# Patient Record
Sex: Female | Born: 1985 | Race: White | Hispanic: No | Marital: Single | Smoking: Current every day smoker
Health system: Southern US, Community
[De-identification: ages and names within clinical notes are randomized; demographics above are authoritative.]

## PROBLEM LIST (undated history)

## (undated) DIAGNOSIS — O169 Unspecified maternal hypertension, unspecified trimester: Secondary | ICD-10-CM

## (undated) DIAGNOSIS — E119 Type 2 diabetes mellitus without complications: Secondary | ICD-10-CM

## (undated) DIAGNOSIS — D649 Anemia, unspecified: Secondary | ICD-10-CM

## (undated) HISTORY — DX: Type 2 diabetes mellitus without complications: E11.9

## (undated) HISTORY — DX: Unspecified maternal hypertension, unspecified trimester: O16.9

## (undated) HISTORY — DX: Anemia, unspecified: D64.9

---

## 2004-11-02 ENCOUNTER — Emergency Department: Payer: Self-pay | Admitting: Emergency Medicine

## 2005-06-08 ENCOUNTER — Emergency Department: Payer: Self-pay | Admitting: Emergency Medicine

## 2008-07-23 ENCOUNTER — Emergency Department: Payer: Self-pay | Admitting: Emergency Medicine

## 2009-04-15 ENCOUNTER — Emergency Department: Payer: Self-pay | Admitting: Emergency Medicine

## 2009-08-11 ENCOUNTER — Emergency Department: Payer: Self-pay | Admitting: Emergency Medicine

## 2009-09-14 ENCOUNTER — Encounter: Payer: Self-pay | Admitting: Maternal & Fetal Medicine

## 2009-09-20 ENCOUNTER — Emergency Department: Payer: Self-pay | Admitting: Emergency Medicine

## 2009-09-21 ENCOUNTER — Encounter: Payer: Self-pay | Admitting: Obstetrics and Gynecology

## 2009-09-21 ENCOUNTER — Emergency Department: Payer: Self-pay

## 2009-09-24 ENCOUNTER — Emergency Department: Payer: Self-pay | Admitting: Emergency Medicine

## 2010-02-07 ENCOUNTER — Emergency Department: Payer: Self-pay | Admitting: Emergency Medicine

## 2010-02-23 ENCOUNTER — Emergency Department: Payer: Self-pay | Admitting: Emergency Medicine

## 2010-04-12 ENCOUNTER — Encounter: Payer: Self-pay | Admitting: Maternal & Fetal Medicine

## 2010-04-16 ENCOUNTER — Observation Stay: Payer: Self-pay | Admitting: Obstetrics and Gynecology

## 2010-04-26 ENCOUNTER — Encounter: Payer: Self-pay | Admitting: Maternal and Fetal Medicine

## 2010-04-29 ENCOUNTER — Encounter: Payer: Self-pay | Admitting: Maternal & Fetal Medicine

## 2010-05-27 ENCOUNTER — Encounter: Payer: Self-pay | Admitting: Maternal and Fetal Medicine

## 2010-06-07 ENCOUNTER — Encounter: Payer: Self-pay | Admitting: Obstetrics and Gynecology

## 2010-07-07 ENCOUNTER — Encounter: Payer: Self-pay | Admitting: Maternal & Fetal Medicine

## 2010-07-29 ENCOUNTER — Observation Stay: Payer: Self-pay | Admitting: Obstetrics and Gynecology

## 2010-08-12 ENCOUNTER — Inpatient Hospital Stay: Payer: Self-pay | Admitting: Obstetrics and Gynecology

## 2010-10-29 ENCOUNTER — Ambulatory Visit: Payer: Self-pay | Admitting: Internal Medicine

## 2010-10-31 ENCOUNTER — Emergency Department: Payer: Self-pay | Admitting: Emergency Medicine

## 2010-11-14 ENCOUNTER — Inpatient Hospital Stay: Payer: Self-pay | Admitting: Internal Medicine

## 2010-11-22 ENCOUNTER — Other Ambulatory Visit: Payer: Self-pay

## 2010-11-25 ENCOUNTER — Ambulatory Visit: Payer: Self-pay

## 2010-11-28 ENCOUNTER — Ambulatory Visit: Payer: Self-pay | Admitting: Internal Medicine

## 2010-11-29 ENCOUNTER — Other Ambulatory Visit: Payer: Self-pay

## 2010-12-02 ENCOUNTER — Other Ambulatory Visit: Payer: Self-pay

## 2011-04-08 ENCOUNTER — Emergency Department: Payer: Self-pay | Admitting: *Deleted

## 2011-09-18 IMAGING — US US OB FOLLOW-UP - NRPT [facility]
1 series · 14 of 28 positions shown · non-contrast
Comparison: none

[Series 1: us ob follow-up - nrpt [facility] · 14 of 44 slices shown]
[im 2/44]
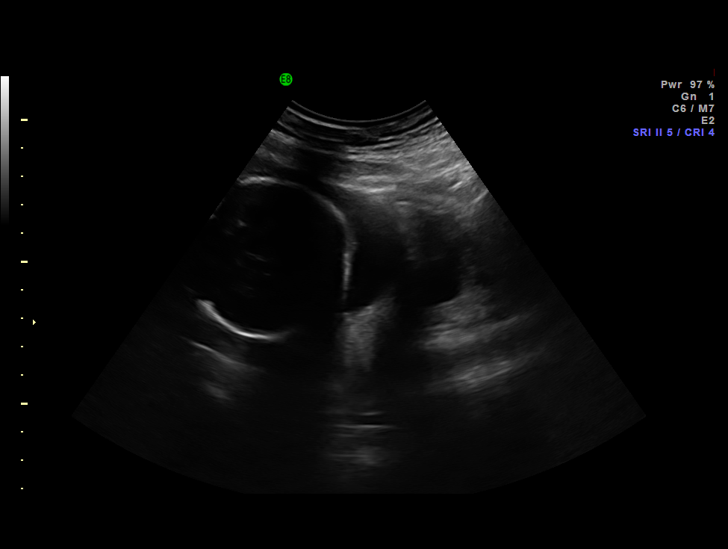
[im 5/44]
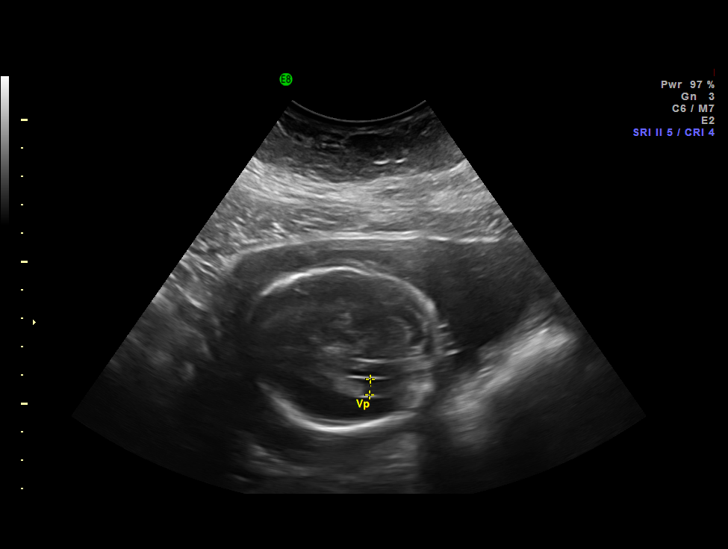
[im 8/44]
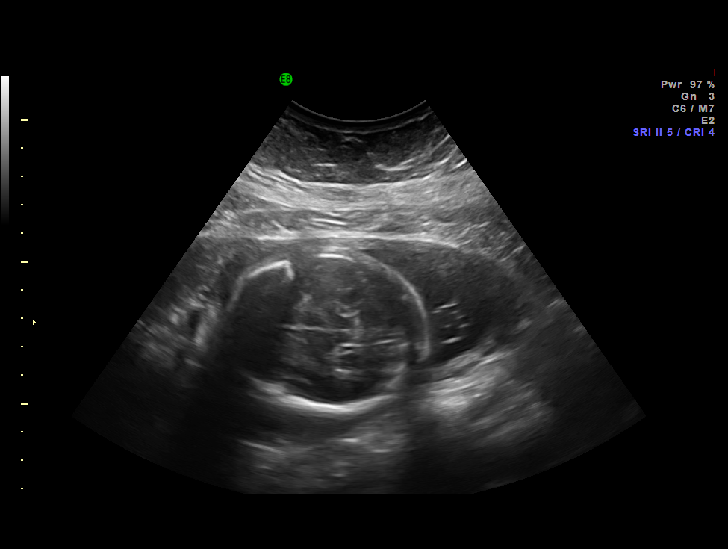
[im 12/44]
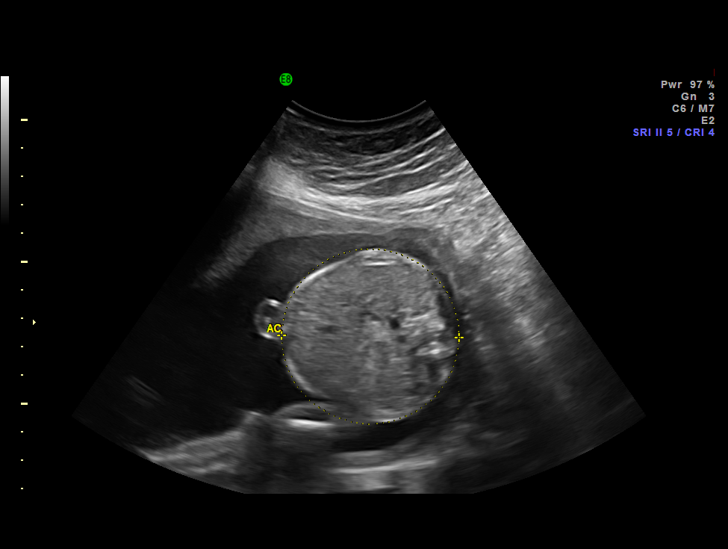
[im 15/44]
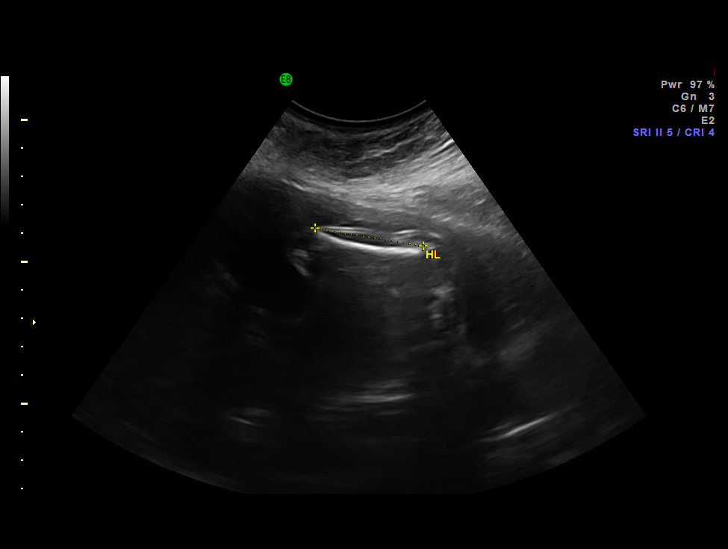
[im 18/44]
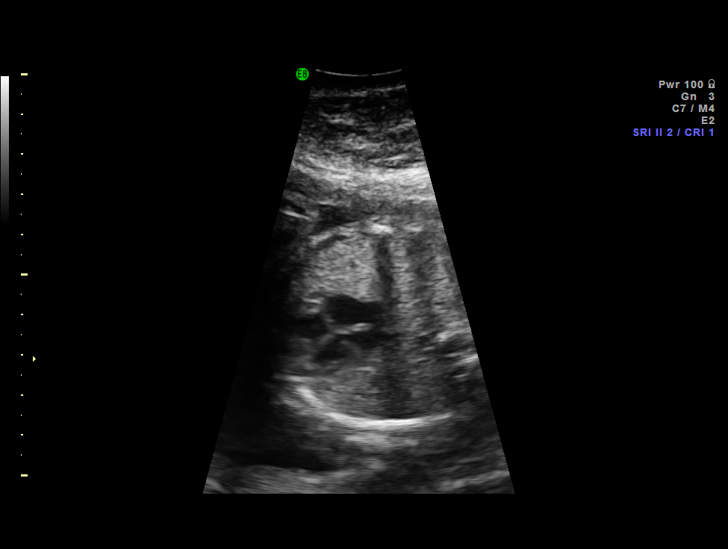
[im 21/44]
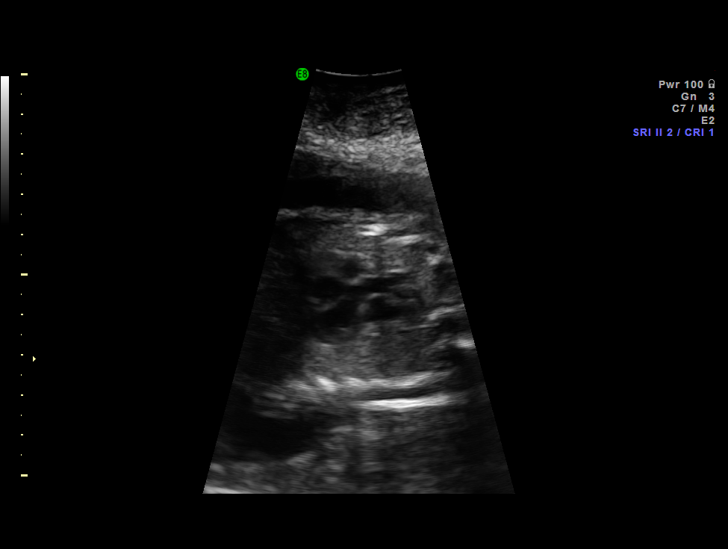
[im 24/44]
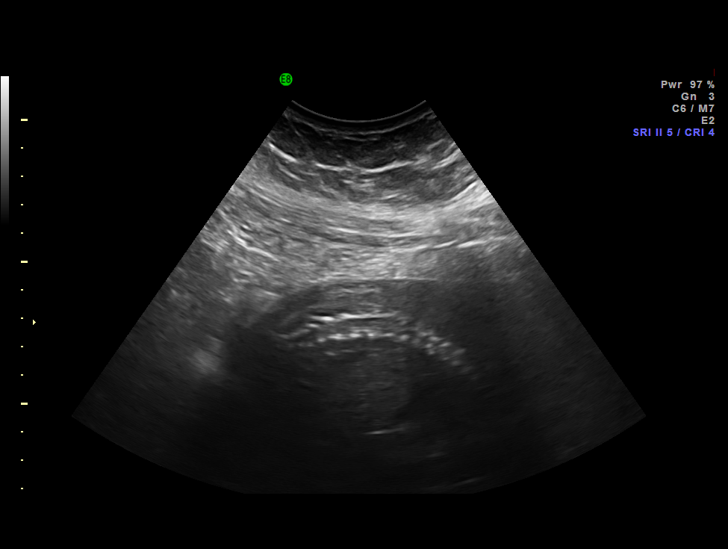
[im 28/44]
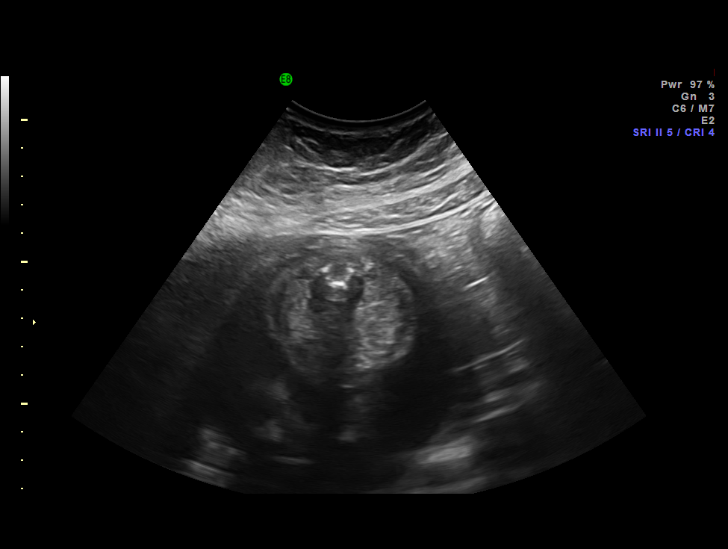
[im 31/44]
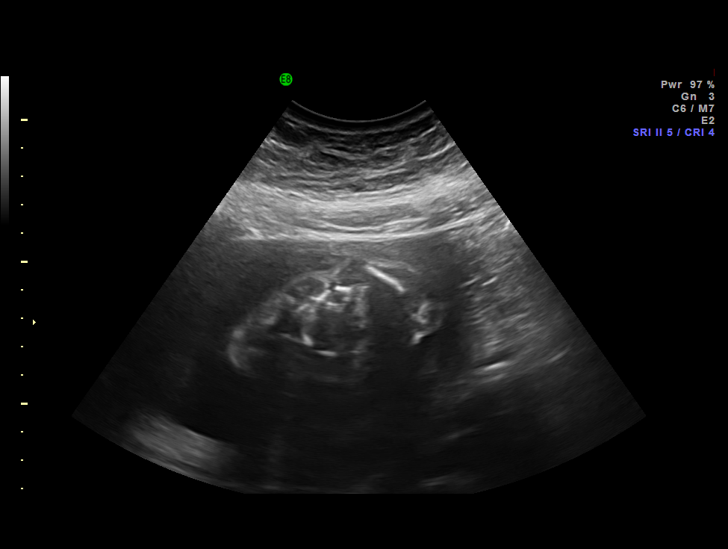
[im 34/44]
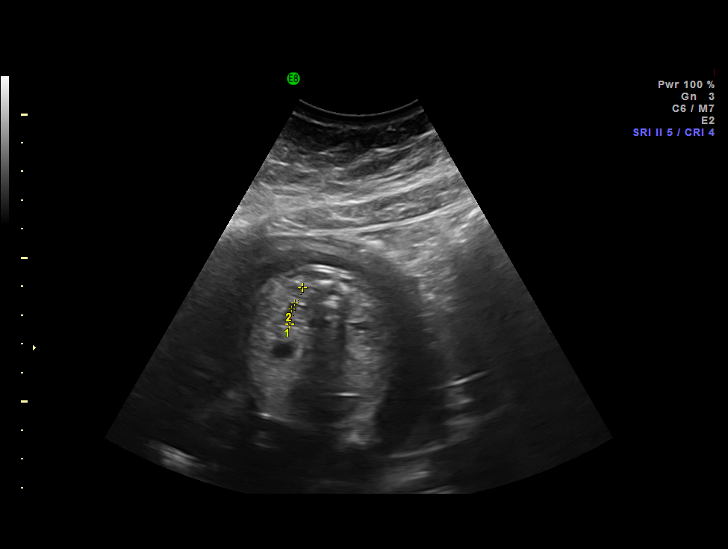
[im 37/44]
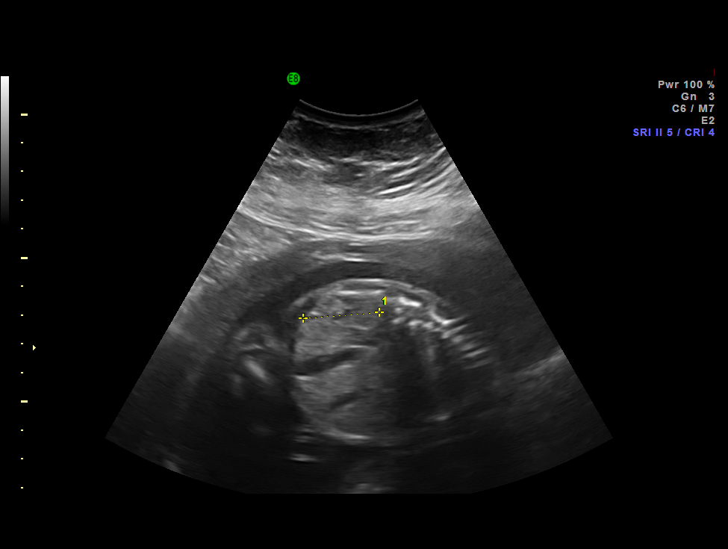
[im 40/44]
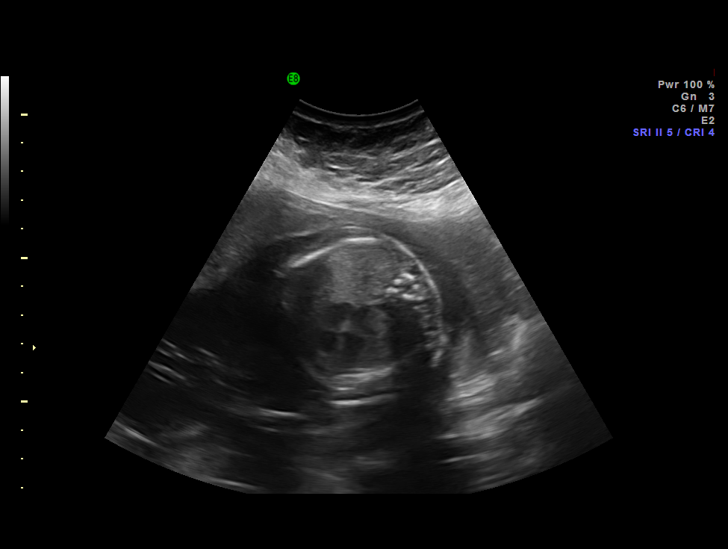
[im 44/44]
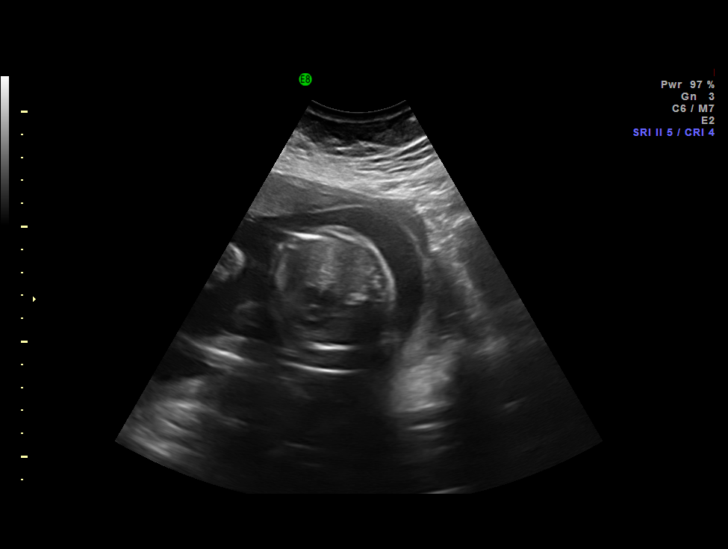

[14 of 28 positions shown; findings below may reference images not displayed]

IMAGES IMPORTED FROM THE [employer] WORKFLOW SYSTEM
NO DICTATION FOR STUDY

## 2012-04-10 IMAGING — CT CT HEAD WITHOUT CONTRAST
2 series · 16 of 30 positions shown, 20 images · non-contrast
Comparison: none

REASON FOR EXAM: headaches and trauma
COMMENTS:

PROCEDURE:     CT  - CT HEAD WITHOUT CONTRAST  - November 17, 2010 [DATE]
RESULT:     Comparison:  None
TECHNIQUE: Multiple axial images from the foramen magnum to the vertex were
obtained without IV contrast.

[Series 2: without · axial · non-contrast · 0.40mm/px · z∈[+484,+604]mm · 13 of 29 slices shown, 17 images]
[im 3/29  brain]
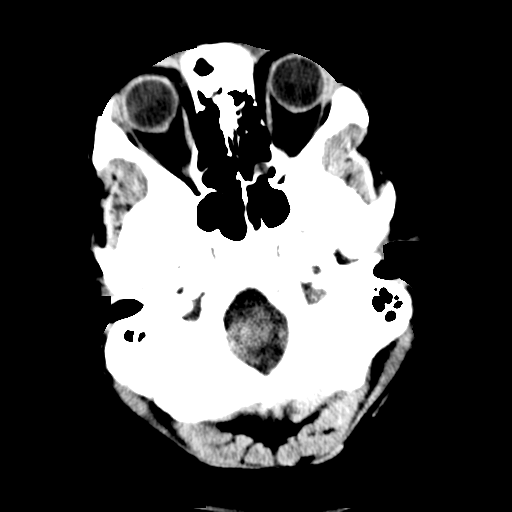
[im 3/29  bone]
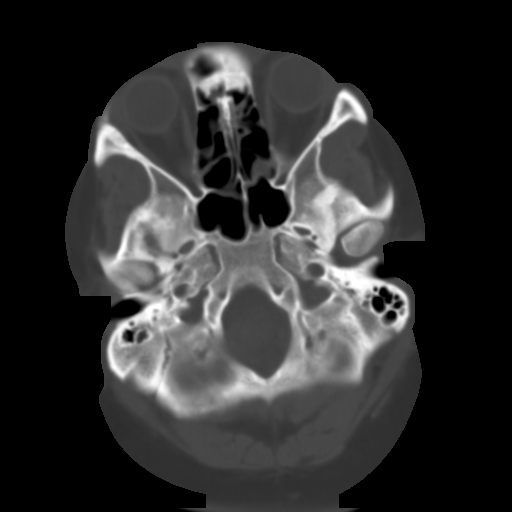
[im 5/29  brain]
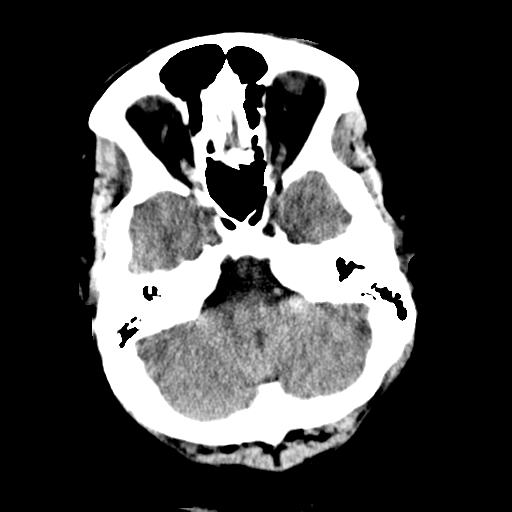
[im 7/29  brain]
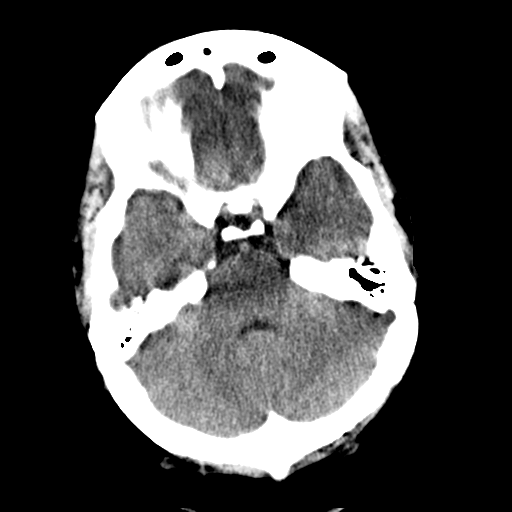
[im 9/29  brain]
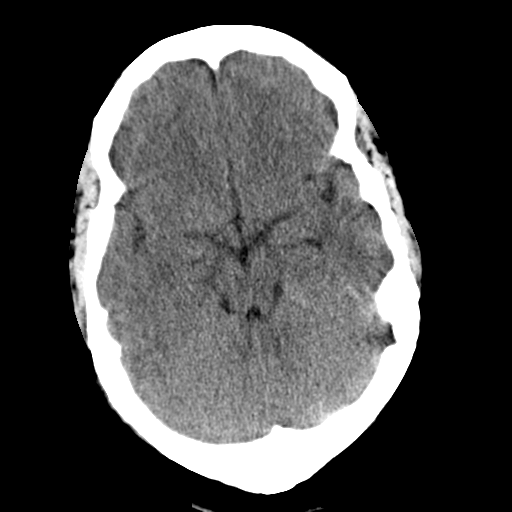
[im 11/29  brain]
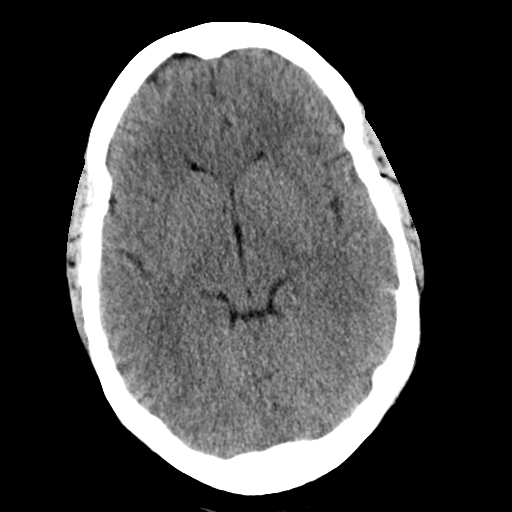
[im 11/29  bone]
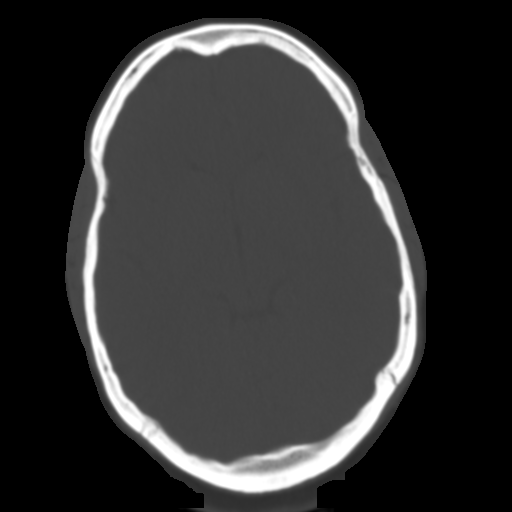
[im 13/29  brain]
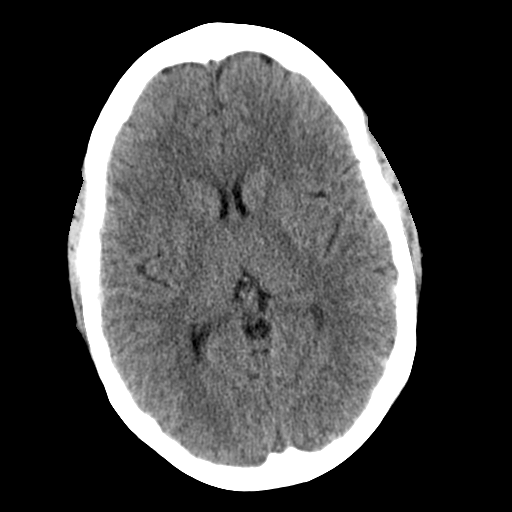
[im 15/29  brain]
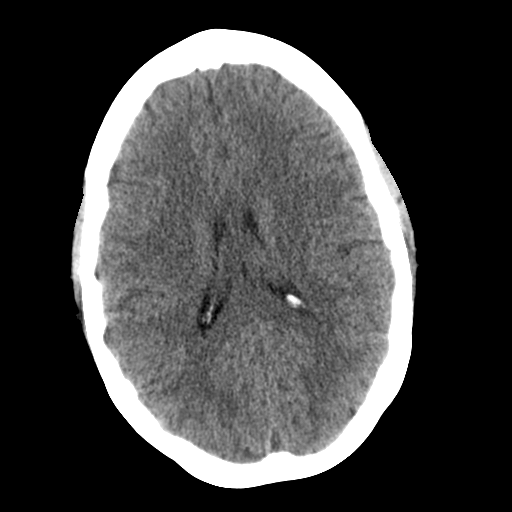
[im 17/29  brain]
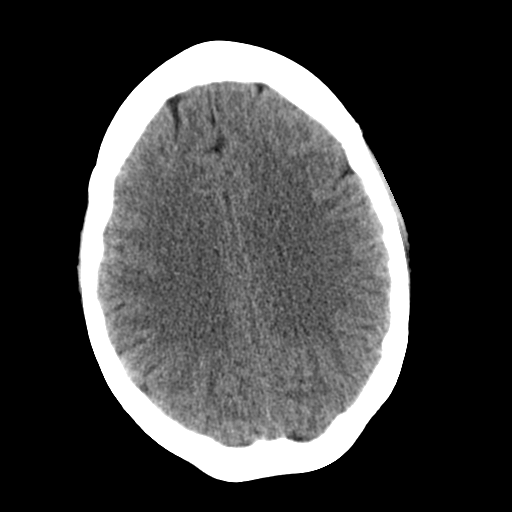
[im 19/29  brain]
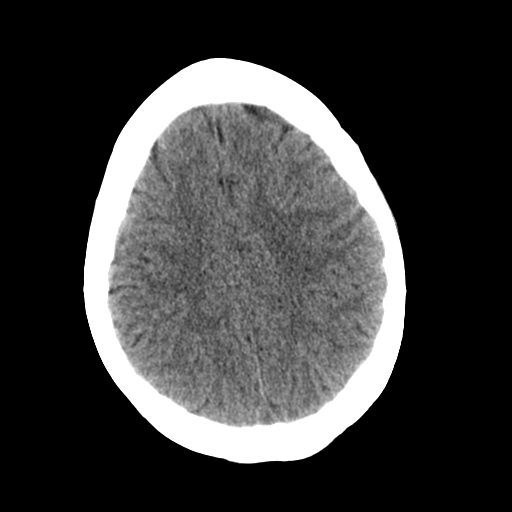
[im 19/29  bone]
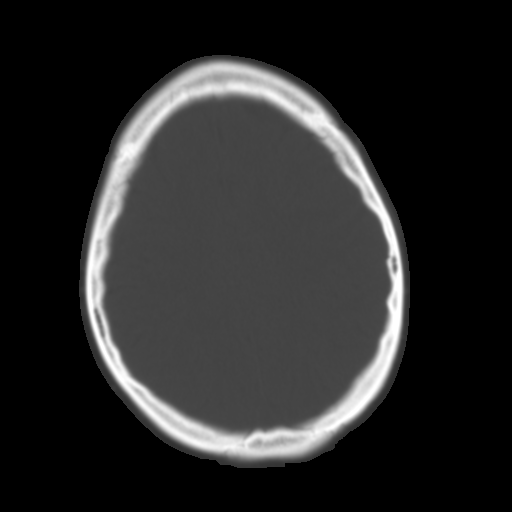
[im 21/29  brain]
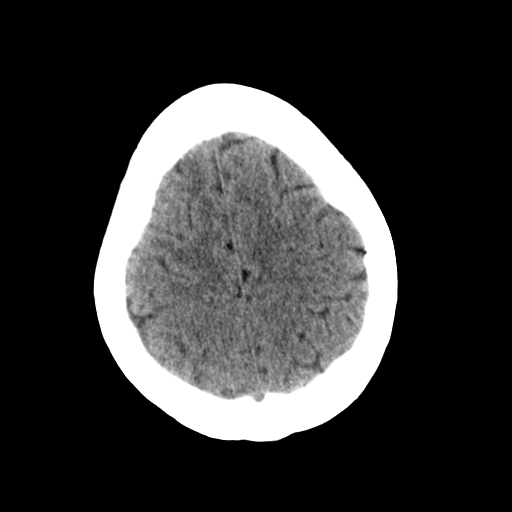
[im 23/29  brain]
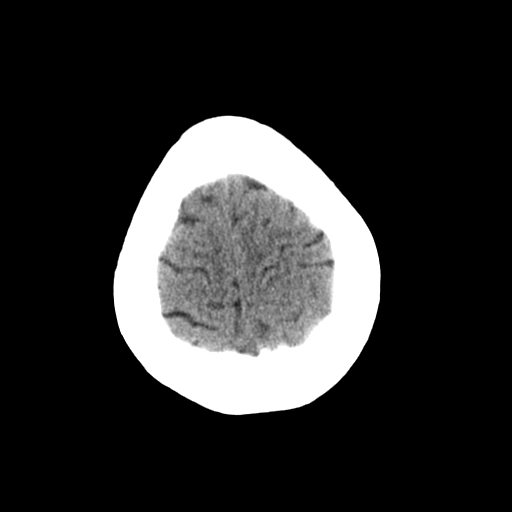
[im 25/29  brain]
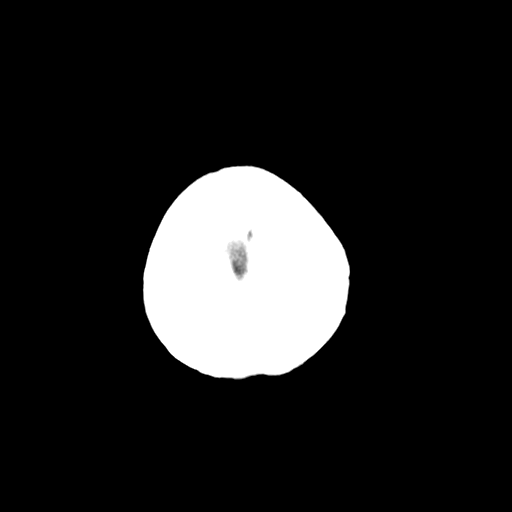
[im 27/29  brain]
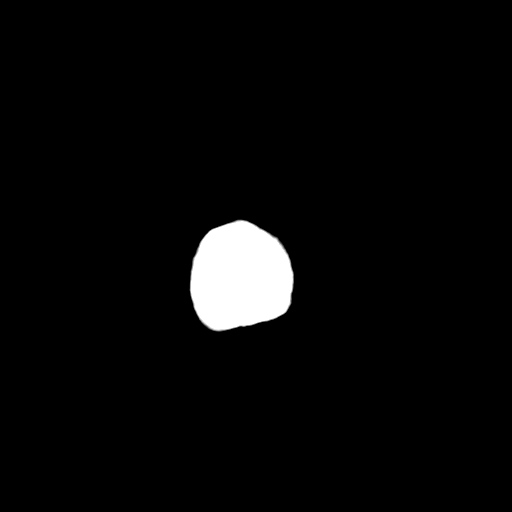
[im 27/29  bone]
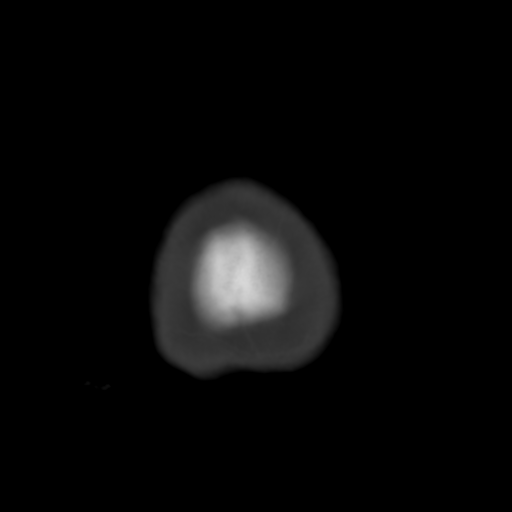

[Series 3: bone · axial · 0.40mm/px · z∈[+484,+524]mm · 3 of 29 slices shown]
[im 3/29  bone]
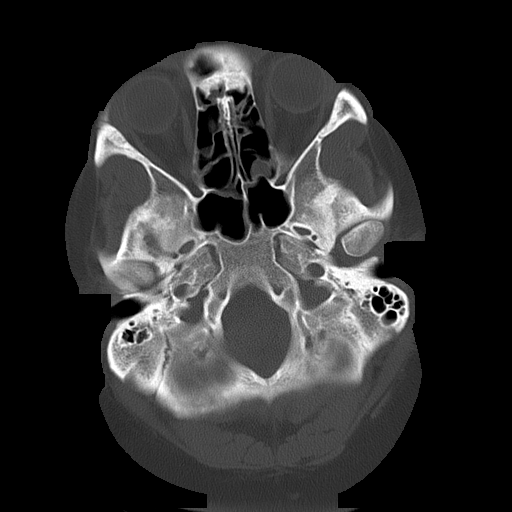
[im 7/29  bone]
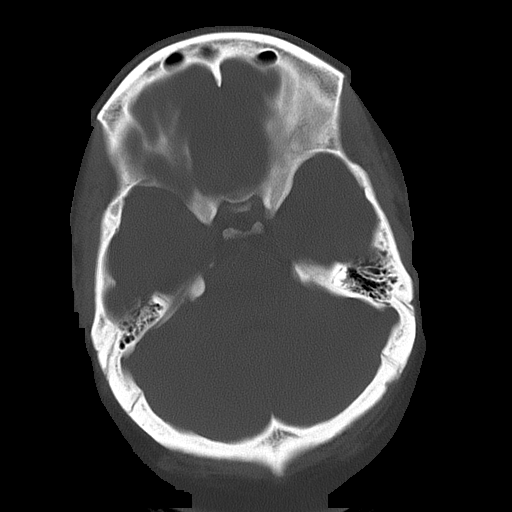
[im 11/29  bone]
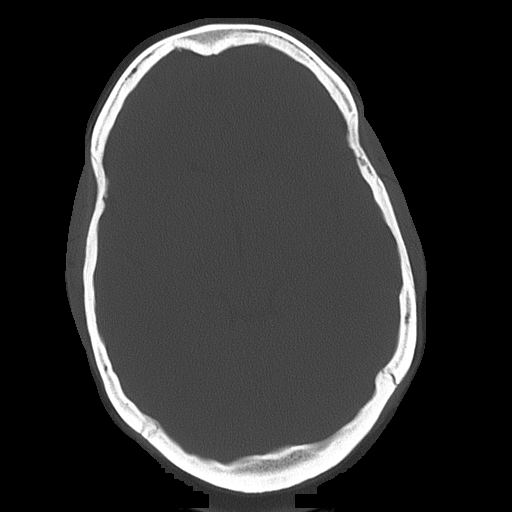

[16 of 30 positions shown; findings below may reference images not displayed]

FINDINGS: There is no evidence for mass effect, midline shift, or extra-axial fluid
collections. There is no evidence for space-occupying lesion, intracranial
hemorrhage, or cortical-based area of infarction.

There is mild mucosal thickening of the ethmoid air cells.

The osseous structures are unremarkable.
IMPRESSION: No acute intracranial process.

## 2018-12-01 ENCOUNTER — Other Ambulatory Visit: Payer: Self-pay

## 2018-12-01 ENCOUNTER — Emergency Department
Admission: EM | Admit: 2018-12-01 | Discharge: 2018-12-02 | Disposition: A | Discharge status: Home / Self Care | Payer: BC Managed Care – PPO | Attending: Emergency Medicine | Admitting: Emergency Medicine

## 2018-12-01 ENCOUNTER — Encounter: Payer: Self-pay | Admitting: Emergency Medicine

## 2018-12-01 DIAGNOSIS — Y906 Blood alcohol level of 120-199 mg/100 ml: Secondary | ICD-10-CM | POA: Diagnosis not present

## 2018-12-01 DIAGNOSIS — F10929 Alcohol use, unspecified with intoxication, unspecified: Secondary | ICD-10-CM | POA: Insufficient documentation

## 2018-12-01 DIAGNOSIS — F17210 Nicotine dependence, cigarettes, uncomplicated: Secondary | ICD-10-CM | POA: Diagnosis not present

## 2018-12-01 DIAGNOSIS — F1510 Other stimulant abuse, uncomplicated: Secondary | ICD-10-CM | POA: Diagnosis not present

## 2018-12-01 LAB — CBC
HCT: 41.7 % (ref 36.0–46.0)
Hemoglobin: 13.8 g/dL (ref 12.0–15.0)
MCH: 27.4 pg (ref 26.0–34.0)
MCHC: 33.1 g/dL (ref 30.0–36.0)
MCV: 82.7 fL (ref 80.0–100.0)
Platelets: 292 10*3/uL (ref 150–400)
RBC: 5.04 MIL/uL (ref 3.87–5.11)
RDW: 14.1 % (ref 11.5–15.5)
WBC: 9.6 10*3/uL (ref 4.0–10.5)
nRBC: 0 % (ref 0.0–0.2)
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
## 2018-12-01 NOTE — ED Notes (Signed)
Pt dressed out in hospital provided clothes. Pt has one pain white tennis shoes, one par rainbow socks, one black t-shirt, one white undershirt, one pair jean shirts, one teal bra, one eyebrow piercing, one gray ring, one phone case without phone, one ID, one phone charger, one pair teal underwear, and ten dollars cash.
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
## 2018-12-02 LAB — COMPREHENSIVE METABOLIC PANEL
ALT: 21 U/L (ref 0–44)
AST: 21 U/L (ref 15–41)
Albumin: 3.9 g/dL (ref 3.5–5.0)
Alkaline Phosphatase: 75 U/L (ref 38–126)
Anion gap: 10 (ref 5–15)
BUN: 8 mg/dL (ref 6–20)
CO2: 25 mmol/L (ref 22–32)
Calcium: 8.2 mg/dL — ABNORMAL LOW (ref 8.9–10.3)
Chloride: 105 mmol/L (ref 98–111)
Creatinine, Ser: 0.67 mg/dL (ref 0.44–1.00)
GFR calc Af Amer: >60 mL/min (ref >60)
GFR calc non Af Amer: >60 mL/min (ref >60)
Glucose, Bld: 135 mg/dL — ABNORMAL HIGH (ref 70–99)
Potassium: 3.4 mmol/L — ABNORMAL LOW (ref 3.5–5.1)
Sodium: 140 mmol/L (ref 135–145)
Total Bilirubin: 0.4 mg/dL (ref 0.3–1.2)
Total Protein: 7.9 g/dL (ref 6.5–8.1)

## 2018-12-02 LAB — URINE DRUG SCREEN, QUALITATIVE (ARMC ONLY)
Amphetamines, Ur Screen: POSITIVE — AB
Barbiturates, Ur Screen: NOT DETECTED
Benzodiazepine, Ur Scrn: NOT DETECTED
Cannabinoid 50 Ng, Ur ~~LOC~~: NOT DETECTED
Cocaine Metabolite,Ur ~~LOC~~: NOT DETECTED
MDMA (Ecstasy)Ur Screen: NOT DETECTED
Methadone Scn, Ur: NOT DETECTED
Opiate, Ur Screen: NOT DETECTED
Phencyclidine (PCP) Ur S: NOT DETECTED
Tricyclic, Ur Screen: NOT DETECTED

## 2018-12-02 LAB — POCT PREGNANCY, URINE: Preg Test, Ur: NEGATIVE

## 2018-12-02 LAB — ETHANOL: Alcohol, Ethyl (B): 145 mg/dL — ABNORMAL HIGH (ref <10)

## 2018-12-02 LAB — TROPONIN I (HIGH SENSITIVITY)
Troponin I (High Sensitivity): 8 ng/L (ref <18)
Troponin I (High Sensitivity): 8 ng/L (ref <18)

## 2018-12-02 LAB — SALICYLATE LEVEL: Salicylate Lvl: <7 mg/dL (ref 2.8–30.0)

## 2018-12-02 LAB — ACETAMINOPHEN LEVEL: Acetaminophen (Tylenol), Serum: <10 ug/mL — ABNORMAL LOW (ref 10–30)

## 2018-12-02 NOTE — ED Notes (Signed)
Tele-psych at bedside.

## 2018-12-02 NOTE — Discharge Instructions (Addendum)

## 2018-12-02 NOTE — ED Notes (Signed)
Patient's father Martie Lee) [phone]
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
## 2018-12-02 NOTE — ED Notes (Signed)
Patient extremely tearful, was sitting up on side of bed wishing to leave. Refuses to wear pulse ox and blood pressure cuff

## 2018-12-02 NOTE — ED Notes (Signed)
Patient transferred to room 24 so she's more visible to staff, as she was getting out of bed and closing the door against advisement
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
## 2018-12-21 ENCOUNTER — Other Ambulatory Visit: Payer: Self-pay

## 2018-12-21 DIAGNOSIS — Z20822 Contact with and (suspected) exposure to covid-19: Secondary | ICD-10-CM

## 2018-12-24 LAB — NOVEL CORONAVIRUS, NAA: SARS-CoV-2, NAA: NOT DETECTED

## 2019-01-01 ENCOUNTER — Ambulatory Visit
Admission: EM | Admit: 2019-01-01 | Discharge: 2019-01-01 | Disposition: A | Discharge status: Home / Self Care | Payer: BC Managed Care – PPO | Attending: Family Medicine | Admitting: Family Medicine

## 2019-01-01 ENCOUNTER — Other Ambulatory Visit: Payer: Self-pay

## 2019-01-01 ENCOUNTER — Encounter: Payer: Self-pay | Admitting: Emergency Medicine

## 2019-01-01 DIAGNOSIS — G44209 Tension-type headache, unspecified, not intractable: Secondary | ICD-10-CM

## 2019-01-01 MED ORDER — BUTALBITAL-APAP-CAFFEINE 50-325-40 MG PO TABS: 1.0000 | ORAL_TABLET | Freq: Four times a day (QID) | ORAL | 0 refills | Status: DC | PRN

## 2019-01-01 MED ORDER — TIZANIDINE HCL 4 MG PO TABS: 4.0000 mg | ORAL_TABLET | Freq: Three times a day (TID) | ORAL | 0 refills | Status: DC | PRN

## 2019-01-01 NOTE — ED Triage Notes (Signed)
Pt c/o headache. Started about 9 days ago. She states that it feels like tension. She has tried ibuprofen, tylenol, changing her sleep habits. Pain is located on top and on the sides of her head.

## 2019-01-01 NOTE — ED Provider Notes (Signed)
[facility]    CSN: [id number] Arrival date & time: 01/01/19  1146  History   Chief Complaint Chief Complaint  Patient presents with  . Headache   HPI  33 year old female presents with headache.  Patient reports a 9-day history of diffuse headache.  Located predominantly in the frontal and occipital regions.  She has tried numerous over-the-counter medications including ibuprofen, Tylenol, Excedrin without resolution.  She reports some nausea.  No vomiting.  No fever.  No reported sick contacts.  Pain is currently 5/10 in severity.  She states that she is under a great deal of stress and thinks that this may be contributing.  No known exacerbating factors.  No other associated symptoms.  PMH, Surgical Hx, Family Hx, Social History reviewed and updated as below.  PMH: Hx of alcohol intoxication; Hx of suicidal ideation  Past Surgical History:  Procedure Laterality Date  . CESAREAN SECTION     OB History   No obstetric history on file.     Home Medications    Prior to Admission medications   Medication Sig Start Date End Date Taking? Authorizing Provider  butalbital-acetaminophen-caffeine (FIORICET) 50-325-40 MG tablet Take 1 tablet by mouth every 6 (six) hours as needed for headache. 01/01/19 01/01/20  Coral Spikes, DO  tiZANidine (ZANAFLEX) 4 MG tablet Take 1 tablet (4 mg total) by mouth every 8 (eight) hours as needed for muscle spasms. 01/01/19   Coral Spikes, DO    Family History Family History  Problem Relation Age of Onset  . Healthy Mother   . Hypertension Father     Social History Social History   Tobacco Use  . Smoking status: Current Every Day Smoker    Packs/day: 1.00    Types: Cigarettes  . Smokeless tobacco: Never Used  Substance Use Topics  . Alcohol use: Yes  . Drug use: Not Currently    Types: Marijuana     Allergies   Patient has no known allergies.   Review of Systems Review of Systems  Gastrointestinal: Positive for nausea.   Neurological: Positive for headaches.   Physical Exam Triage Vital Signs ED Triage Vitals  Enc Vitals Group     BP 01/01/19 1251 (!) 155/89     Pulse Rate 01/01/19 1251 89     Resp 01/01/19 1251 18     Temp 01/01/19 1251 98.4 F (36.9 C)     Temp Source 01/01/19 1251 Oral     SpO2 01/01/19 1251 98 %     Weight 01/01/19 1248 270 lb (122.5 kg)     Height 01/01/19 1248 5\' 1"  (1.549 m)     Head Circumference --      Peak Flow --      Pain Score 01/01/19 1248 5     Pain Loc --      Pain Edu? --      Excl. in [place]? --    No data found.  Updated Vital Signs BP (!) 155/89 (BP Location: Right Arm)   Pulse 89   Temp 98.4 F (36.9 C) (Oral)   Resp 18   Ht 5\' 1"  (1.549 m)   Wt 122.5 kg   LMP 12/01/2018 (Approximate)   SpO2 98%   BMI 51.02 kg/m   Visual Acuity Right Eye Distance:   Left Eye Distance:   Bilateral Distance:    Right Eye Near:   Left Eye Near:    Bilateral Near:     Physical Exam Vitals signs and nursing  note reviewed.  Constitutional:      General: She is not in acute distress.    Appearance: She is well-developed. She is obese.  HENT:     Head: Normocephalic and atraumatic.  Eyes:     General:        Right eye: No discharge.        Left eye: No discharge.     Conjunctiva/sclera: Conjunctivae normal.  Cardiovascular:     Rate and Rhythm: Normal rate and regular rhythm.  Pulmonary:     Effort: Pulmonary effort is normal.     Breath sounds: Normal breath sounds.  Neurological:     General: No focal deficit present.     Mental Status: She is alert and oriented to person, place, and time.  Psychiatric:        Mood and Affect: Mood normal.        Behavior: Behavior normal.    UC Treatments / Results  Labs (all labs ordered are listed, but only abnormal results are displayed) Labs Reviewed - No data to display  EKG   Radiology No results found.  Procedures Procedures (including critical care time)  Medications Ordered in UC Medications  - No data to display  Initial Impression / Assessment and Plan / UC Course  I have reviewed the triage vital signs and the nursing notes.  Pertinent labs & imaging results that were available during my care of the patient were reviewed by me and considered in my medical decision making (see chart for details).    33 year old female presents with tension headache.  Treating with Fioricet and Zanaflex.  Final Clinical Impressions(s) / UC Diagnoses   Final diagnoses:  Tension headache   Discharge Instructions   None    ED Prescriptions    Medication Sig Dispense Auth. Provider   butalbital-acetaminophen-caffeine (FIORICET) 50-325-40 MG tablet Take 1 tablet by mouth every 6 (six) hours as needed for headache. 20 tablet Rania Prothero G, DO   tiZANidine (ZANAFLEX) 4 MG tablet Take 1 tablet (4 mg total) by mouth every 8 (eight) hours as needed for muscle spasms. 30 tablet Tommie Samsook, Patric Buckhalter G, DO     Controlled Substance Prescriptions [place] Controlled Substance Registry consulted? Not Applicable   Tommie SamsCook, Remi Rester G, DO 01/01/19 1404

## 2019-03-26 ENCOUNTER — Other Ambulatory Visit: Payer: Self-pay

## 2019-03-26 ENCOUNTER — Emergency Department
Admission: EM | Admit: 2019-03-26 | Discharge: 2019-03-26 | Disposition: A | Discharge status: Home / Self Care | Payer: Worker's Compensation | Attending: Emergency Medicine | Admitting: Emergency Medicine

## 2019-03-26 ENCOUNTER — Emergency Department: Payer: Worker's Compensation

## 2019-03-26 DIAGNOSIS — X500XXA Overexertion from strenuous movement or load, initial encounter: Secondary | ICD-10-CM | POA: Insufficient documentation

## 2019-03-26 DIAGNOSIS — F17210 Nicotine dependence, cigarettes, uncomplicated: Secondary | ICD-10-CM | POA: Insufficient documentation

## 2019-03-26 DIAGNOSIS — M62838 Other muscle spasm: Secondary | ICD-10-CM

## 2019-03-26 DIAGNOSIS — R079 Chest pain, unspecified: Secondary | ICD-10-CM | POA: Diagnosis present

## 2019-03-26 MED ORDER — MELOXICAM 7.5 MG PO TABS: 7.5000 mg | ORAL_TABLET | Freq: Every day | ORAL | 0 refills | Status: DC

## 2019-03-26 MED ORDER — CYCLOBENZAPRINE HCL 5 MG PO TABS: ORAL_TABLET | ORAL | 0 refills | Status: DC

## 2019-03-26 MED ORDER — KETOROLAC TROMETHAMINE 30 MG/ML IJ SOLN
30.0000 mg | Freq: Once | INTRAMUSCULAR | Status: AC
  Administered 2019-03-26: 12:00:00 30 mg via INTRAMUSCULAR
  Filled 2019-03-26: qty 1

## 2019-03-26 MED ORDER — ORPHENADRINE CITRATE 30 MG/ML IJ SOLN
60.0000 mg | Freq: Two times a day (BID) | INTRAMUSCULAR | Status: DC
  Administered 2019-03-26: 12:00:00 60 mg via INTRAMUSCULAR
  Filled 2019-03-26: qty 2

## 2019-03-26 MED ORDER — LIDOCAINE 5 % EX PTCH
1.0000 | MEDICATED_PATCH | CUTANEOUS | Status: DC
  Administered 2019-03-26: 12:00:00 1 via TRANSDERMAL
  Filled 2019-03-26: qty 1

## 2019-03-26 MED ORDER — OXYCODONE-ACETAMINOPHEN 5-325 MG PO TABS
1.0000 | ORAL_TABLET | Freq: Once | ORAL | Status: AC
  Administered 2019-03-26: 1 via ORAL
  Filled 2019-03-26: qty 1

## 2019-03-26 MED ORDER — LIDOCAINE 5 % EX PTCH: 1.0000 | MEDICATED_PATCH | CUTANEOUS | 0 refills | Status: DC

## 2019-03-26 NOTE — ED Triage Notes (Signed)
Pt comes into the ED via EMS from work, states she was lifting a box over her head and felt something pop mid sternum. Pt is in NAD at present. EMS reports 146/80, 85HR, 100% on RA. Pt is in NAD

## 2019-03-26 NOTE — ED Provider Notes (Signed)
[facility] Emergency Department Provider Note  ____________________________________________  Time seen: Approximately 11:14 AM  I have reviewed the triage vital signs and the nursing notes.   HISTORY  Chief Complaint Chest Injury    HPI Olivia Alexander is a 33 y.o. female that presents to the emergency department for evaluation of injury to chest today.  Patient was lifting a heavy box at work when she immediately felt a pop to her central chest.  She states that area feels tight like a muscle spasm.  Area is worse with movement and when she touches area.  No previous injury.   History reviewed. No pertinent past medical history.  There are no active problems to display for this patient.   Past Surgical History:  Procedure Laterality Date  . CESAREAN SECTION      Prior to Admission medications   Medication Sig Start Date End Date Taking? Authorizing Provider  FLUoxetine (PROZAC) 20 MG tablet Take 20 mg by mouth daily.   Yes [provider]  butalbital-acetaminophen-caffeine (FIORICET) [phone] MG tablet Take 1 tablet by mouth every 6 (six) hours as needed for headache. 01/01/19 01/01/20  Coral Spikes, DO  cyclobenzaprine (FLEXERIL) 5 MG tablet Take 1-2 tablets 3 times daily as needed 03/26/19   Laban Emperor, PA-C  lidocaine (LIDODERM) 5 % Place 1 patch onto the skin daily. Remove & Discard patch within 12 hours or as directed by MD 03/26/19   Laban Emperor, PA-C  meloxicam (MOBIC) 7.5 MG tablet Take 1 tablet (7.5 mg total) by mouth daily. 03/26/19 03/25/20  Laban Emperor, PA-C    Allergies Patient has no known allergies.  Family History  Problem Relation Age of Onset  . Healthy Mother   . Hypertension Father     Social History Social History   Tobacco Use  . Smoking status: Current Every Day Smoker    Packs/day: 1.00    Types: Cigarettes  . Smokeless tobacco: Never Used  Substance Use Topics  . Alcohol use: Yes  . Drug use:  Not Currently    Types: Marijuana     Review of Systems  Cardiovascular: Chest wall pain. Respiratory: No SOB. Gastrointestinal: No abdominal pain.  No nausea, no vomiting.  Musculoskeletal: Negative for musculoskeletal pain. Skin: Negative for rash, abrasions, lacerations, ecchymosis. Neurological: Negative for headaches   ____________________________________________   PHYSICAL EXAM:  VITAL SIGNS: ED Triage Vitals  Enc Vitals Group     BP 03/26/19 1012 (!) 148/93     Pulse Rate 03/26/19 1012 80     Resp 03/26/19 1012 18     Temp 03/26/19 1012 98.9 F (37.2 C)     Temp Source 03/26/19 1012 Oral     SpO2 03/26/19 1012 96 %     Weight 03/26/19 1011 270 lb (122.5 kg)     Height 03/26/19 1011 5\' 1"  (1.549 m)     Head Circumference --      Peak Flow --      Pain Score 03/26/19 1011 6     Pain Loc --      Pain Edu? --      Excl. in [place]? --      Constitutional: Alert and oriented. Well appearing and in no acute distress. Eyes: Conjunctivae are normal. PERRL. EOMI. Head: Atraumatic. ENT:      Ears:      Nose: No congestion/rhinnorhea.      Mouth/Throat: Mucous membranes are moist.  Neck: No stridor.  Cardiovascular: Normal rate, regular rhythm.  Good peripheral circulation. Respiratory: Normal respiratory effort without tachypnea or retractions. Lungs CTAB. Good air entry to the bases with no decreased or absent breath sounds. Musculoskeletal: Full range of motion to all extremities. No gross deformities appreciated.  Tenderness to palpation to inferior sternum.  Pain elicited with range of motion. Neurologic:  Normal speech and language. No gross focal neurologic deficits are appreciated.  Skin:  Skin is warm, dry and intact. No rash noted. Psychiatric: Mood and affect are normal. Speech and behavior are normal. Patient exhibits appropriate insight and judgement.   ____________________________________________   LABS (all labs ordered are listed, but only abnormal  results are displayed)  Labs Reviewed - No data to display ____________________________________________  EKG   ____________________________________________  RADIOLOGY Lexine Baton, personally viewed and evaluated these images (plain radiographs) as part of my medical decision making, as well as reviewing the written report by the radiologist.  Dg Chest 2 View  Result Date: 03/26/2019 CLINICAL DATA:  Sternal injury at work. EXAM: CHEST - 2 VIEW COMPARISON:  None. FINDINGS: The cardiac silhouette, mediastinal and hilar contours are normal. The lungs are clear. No pneumothorax or pleural effusion. The bony thorax is intact. The thoracic vertebral bodies are normally aligned. The sternum is intact. The sternoclavicular and AC joints appear normal. IMPRESSION: No acute cardiopulmonary findings. Intact bony thorax. Electronically Signed   By: Rudie Meyer M.D.   On: 03/26/2019 11:17    ____________________________________________    PROCEDURES  Procedure(s) performed:    Procedures    Medications  ketorolac (TORADOL) 30 MG/ML injection 30 mg (30 mg Intramuscular Given 03/26/19 1133)  oxyCODONE-acetaminophen (PERCOCET/ROXICET) 5-325 MG per tablet 1 tablet (1 tablet Oral Given 03/26/19 1132)     ____________________________________________   INITIAL IMPRESSION / ASSESSMENT AND PLAN / ED COURSE  Pertinent labs & imaging results that were available during my care of the patient were reviewed by me and considered in my medical decision making (see chart for details).  Review of the [place] CSRS was performed in accordance of the [facility] prior to dispensing any controlled drugs.   Symptoms are consistent with muscle spasm.  Vital signs and exam are reassuring.  Chest x-ray negative for acute cardiopulmonary processes.  Pain is elicited with range of motion and with palpation.  Symptoms significantly improved with Toradol, Norflex, Percocet.  Patient will be discharged home with  prescriptions for Flexeril, Mobic, Lidoderm. Patient is to follow up with primary care as directed. Patient is given ED precautions to return to the ED for any worsening or new symptoms.  Olivia Alexander was evaluated in Emergency Department on 03/26/2019 for the symptoms described in the history of present illness. She was evaluated in the context of the global COVID-19 pandemic, which necessitated consideration that the patient might be at risk for infection with the SARS-CoV-2 virus that causes COVID-19. Institutional protocols and algorithms that pertain to the evaluation of patients at risk for COVID-19 are in a state of rapid change based on information released by regulatory bodies including the CDC and federal and state organizations. These policies and algorithms were followed during the patient's care in the ED.   ____________________________________________  FINAL CLINICAL IMPRESSION(S) / ED DIAGNOSES  Final diagnoses:  Muscle spasm      NEW MEDICATIONS STARTED DURING THIS VISIT:  ED Discharge Orders         Ordered    cyclobenzaprine (FLEXERIL) 5 MG tablet     03/26/19 1258    lidocaine (LIDODERM) 5 %  Every 24 hours     03/26/19 1258    meloxicam (MOBIC) 7.5 MG tablet  Daily     03/26/19 1258              This chart was dictated using voice recognition software/Dragon. Despite best efforts to proofread, errors can occur which can change the meaning. Any change was purely unintentional.    Enid DerryWagner, Zoiey Christy, PA-C 03/26/19 1706    Dionne BucySiadecki, Sebastian, MD 03/27/19 [phone]

## 2019-03-26 NOTE — ED Notes (Signed)
See triage note  Presents via EMS   States she lifted a box over head  Felt a "pop" in mid chest  Now having some tightness in chest  Denies any fall

## 2019-06-28 ENCOUNTER — Ambulatory Visit
Admission: EM | Admit: 2019-06-28 | Discharge: 2019-06-28 | Disposition: A | Discharge status: Home / Self Care | Payer: BC Managed Care – PPO | Attending: Family Medicine | Admitting: Family Medicine

## 2019-06-28 ENCOUNTER — Other Ambulatory Visit: Payer: Self-pay

## 2019-06-28 ENCOUNTER — Encounter: Payer: Self-pay | Admitting: Emergency Medicine

## 2019-06-28 DIAGNOSIS — A5901 Trichomonal vulvovaginitis: Secondary | ICD-10-CM | POA: Insufficient documentation

## 2019-06-28 DIAGNOSIS — B9689 Other specified bacterial agents as the cause of diseases classified elsewhere: Secondary | ICD-10-CM | POA: Insufficient documentation

## 2019-06-28 DIAGNOSIS — N390 Urinary tract infection, site not specified: Secondary | ICD-10-CM

## 2019-06-28 DIAGNOSIS — N760 Acute vaginitis: Secondary | ICD-10-CM | POA: Diagnosis present

## 2019-06-28 DIAGNOSIS — R319 Hematuria, unspecified: Secondary | ICD-10-CM | POA: Insufficient documentation

## 2019-06-28 DIAGNOSIS — Z113 Encounter for screening for infections with a predominantly sexual mode of transmission: Secondary | ICD-10-CM | POA: Insufficient documentation

## 2019-06-28 LAB — URINALYSIS, COMPLETE (UACMP) WITH MICROSCOPIC
Bilirubin Urine: NEGATIVE
Glucose, UA: NEGATIVE mg/dL
Ketones, ur: NEGATIVE mg/dL
Nitrite: POSITIVE — AB
Protein, ur: NEGATIVE mg/dL
Specific Gravity, Urine: 1.025 (ref 1.005–1.030)
pH: 6 (ref 5.0–8.0)

## 2019-06-28 LAB — WET PREP, GENITAL
Sperm: NONE SEEN
Yeast Wet Prep HPF POC: NONE SEEN

## 2019-06-28 MED ORDER — CEPHALEXIN 500 MG PO CAPS: 500.0000 mg | ORAL_CAPSULE | Freq: Two times a day (BID) | ORAL | 0 refills | Status: AC

## 2019-06-28 MED ORDER — METRONIDAZOLE 500 MG PO TABS: 500.0000 mg | ORAL_TABLET | Freq: Two times a day (BID) | ORAL | 0 refills | Status: DC

## 2019-06-28 NOTE — ED Provider Notes (Signed)
[facility] ____________________________________________  Time seen: Approximately 2:49 PM  I have reviewed the triage vital signs and the nursing notes.   HISTORY  Chief Complaint Dysuria and Vaginal Discharge   HPI Olivia Alexander is a 34 y.o. female presented for evaluation of 2 weeks of urinary frequency, urinary urgency and occasional burning with urination.  Also reports intermittent vaginal irritation and discharge.  States vaginal discharge has been whitish in color.  Sexually active with recent partner change, stating to new partners.  Denies concern of pregnancy.  Does want to have gonorrhea and Chlamydia testing completed.  Denies abdominal pain, back pain, fevers, vomiting, diarrhea or other complaints.  Denies aggravating alleviating factors.  Reports otherwise doing well.  Patient's last menstrual period was 06/18/2019 (approximate).  Denies pregnancy.    History reviewed. No pertinent past medical history.  There are no problems to display for this patient.   Past Surgical History:  Procedure Laterality Date  . CESAREAN SECTION       No current facility-administered medications for this encounter.  Current Outpatient Medications:  .  cephALEXin (KEFLEX) 500 MG capsule, Take 1 capsule (500 mg total) by mouth 2 (two) times daily for 7 days., Disp: 14 capsule, Rfl: 0 .  metroNIDAZOLE (FLAGYL) 500 MG tablet, Take 1 tablet (500 mg total) by mouth 2 (two) times daily., Disp: 14 tablet, Rfl: 0  Allergies Patient has no known allergies.  Family History  Problem Relation Age of Onset  . Healthy Mother   . Hypertension Father     Social History Social History   Tobacco Use  . Smoking status: Current Every Day Smoker    Packs/day: 1.00    Types: Cigarettes  . Smokeless tobacco: Never Used  Substance Use Topics  . Alcohol use: Yes  . Drug use: Not Currently    Types: Marijuana    Review of Systems Constitutional: No fever ENT: No sore  throat. Cardiovascular: Denies chest pain. Respiratory: Denies shortness of breath. Gastrointestinal: No abdominal pain.  No nausea, no vomiting.  No diarrhea.  No constipation. Genitourinary: As above.  Musculoskeletal: Negative for back pain. Skin: Negative for rash.  ____________________________________________   PHYSICAL EXAM:  VITAL SIGNS: ED Triage Vitals  Enc Vitals Group     BP 06/28/19 1405 138/90     Pulse Rate 06/28/19 1405 94     Resp 06/28/19 1405 16     Temp 06/28/19 1405 98.3 F (36.8 C)     Temp Source 06/28/19 1405 Oral     SpO2 06/28/19 1405 96 %     Weight 06/28/19 1402 270 lb (122.5 kg)     Height 06/28/19 1402 5\' 1"  (1.549 m)     Head Circumference --      Peak Flow --      Pain Score 06/28/19 1402 7     Pain Loc --      Pain Edu? --      Excl. in [place]? --     Constitutional: Alert and oriented. Well appearing and in no acute distress. Eyes: Conjunctivae are normal.  ENT      Head: Normocephalic and atraumatic. Cardiovascular: Normal rate, regular rhythm. Grossly normal heart sounds.  Good peripheral circulation. Respiratory: Normal respiratory effort without tachypnea nor retractions. Breath sounds are clear and equal bilaterally. No wheezes, rales, rhonchi. Gastrointestinal: Soft and nontender.No CVA tenderness. Musculoskeletal: Steady gait.  Neurologic:  Normal speech and language.  Speech is normal. No gait instability.  Skin:  Skin is warm,  dry and intact. No rash noted. Psychiatric: Mood and affect are normal. Speech and behavior are normal. Patient exhibits appropriate insight and judgment   ___________________________________________   LABS (all labs ordered are listed, but only abnormal results are displayed)  Labs Reviewed  WET PREP, GENITAL - Abnormal; Notable for the following components:      Result Value   Trich, Wet Prep PRESENT (*)    Clue Cells Wet Prep HPF POC PRESENT (*)    WBC, Wet Prep HPF POC FEW (*)    All other  components within normal limits  URINALYSIS, COMPLETE (UACMP) WITH MICROSCOPIC - Abnormal; Notable for the following components:   APPearance CLOUDY (*)    Hgb urine dipstick SMALL (*)    Nitrite POSITIVE (*)    Leukocytes,Ua SMALL (*)    Bacteria, UA MANY (*)    Trichomonas, UA PRESENT (*)    All other components within normal limits  GC/CHLAMYDIA PROBE AMP  URINE CULTURE  RPR  HIV ANTIBODY (ROUTINE TESTING W REFLEX)   PROCEDURES Procedures    INITIAL IMPRESSION / ASSESSMENT AND PLAN / ED COURSE  Pertinent labs & imaging results that were available during my care of the patient were reviewed by me and considered in my medical decision making (see chart for details).  Well-appearing patient.  No acute distress.  Urinalysis reviewed, UTI, will culture.  Will empirically treat with oral Keflex.  Patient elected for self wet prep and self GC chlamydia, wet prep positive for clue cells and trichomonas.  Will treat with Flagyl twice daily x7 days.  Patient also elected for RPR and HIV testing.  Encouraged no sexual activity for at least 1 week, partners need to be treated and encourage follow-up with primary care health department.Discussed indication, risks and benefits of medications with patient.   Discussed follow up with Primary care physician or health department this week. Discussed follow up and return parameters including no resolution or any worsening concerns. Patient verbalized understanding and agreed to plan.   ____________________________________________   FINAL CLINICAL IMPRESSION(S) / ED DIAGNOSES  Final diagnoses:  Urinary tract infection with hematuria, site unspecified  BV (bacterial vaginosis)  Trichomonas vaginitis  Screen for STD (sexually transmitted disease)     ED Discharge Orders         Ordered    metroNIDAZOLE (FLAGYL) 500 MG tablet  2 times daily     06/28/19 1501    cephALEXin (KEFLEX) 500 MG capsule  2 times daily     06/28/19 1501            Note: This dictation was prepared with Dragon dictation along with smaller phrase technology. Any transcriptional errors that result from this process are unintentional.         Renford Dills, NP 06/28/19 [phone]

## 2019-06-28 NOTE — Discharge Instructions (Addendum)
Take medication as prescribed. Rest. Drink plenty of fluids. No sexual activity for at least 1 week.  Please inform partners as they need to be treated.  Follow up with your primary care physician this week or health department.  Return to Urgent care for new or worsening concerns.

## 2019-06-28 NOTE — ED Triage Notes (Signed)
Patient c/o burning when urinating and urinary urgency that started about 2 weeks ago.  Patient also reports a vaginal discharge white in color.  Patient reports vaginal itching and tenderness.  Patient denies fevers.

## 2019-06-29 LAB — RPR: RPR Ser Ql: NONREACTIVE

## 2019-06-29 LAB — HIV ANTIBODY (ROUTINE TESTING W REFLEX): HIV Screen 4th Generation wRfx: REACTIVE — AB

## 2019-07-01 LAB — URINE CULTURE: Culture: >=100000 — AB

## 2019-07-01 LAB — GC/CHLAMYDIA PROBE AMP
Chlamydia trachomatis, NAA: NEGATIVE
Neisseria Gonorrhoeae by PCR: NEGATIVE

## 2019-07-03 LAB — HIV-1/2 AB - DIFFERENTIATION
HIV 1 Ab: NEGATIVE
HIV 2 Ab: NEGATIVE
Note: NEGATIVE

## 2019-07-03 LAB — RNA QUALITATIVE: HIV 1 RNA Qualitative: 1

## 2019-07-04 ENCOUNTER — Telehealth (HOSPITAL_COMMUNITY): Payer: Self-pay | Admitting: Emergency Medicine

## 2019-07-04 NOTE — Telephone Encounter (Signed)
HIV Negative, false positive on screening. If indicated, pt return in 3 months for repeat testing. Patient contacted by phone and made aware of    results. Pt verbalized understanding and had all questions answered.

## 2021-03-20 ENCOUNTER — Other Ambulatory Visit: Payer: Self-pay

## 2021-03-20 ENCOUNTER — Emergency Department
Admission: EM | Admit: 2021-03-20 | Discharge: 2021-03-20 | Disposition: A | Discharge status: Home / Self Care | Payer: BC Managed Care – PPO | Attending: Emergency Medicine | Admitting: Emergency Medicine

## 2021-03-20 ENCOUNTER — Encounter: Payer: Self-pay | Admitting: Emergency Medicine

## 2021-03-20 DIAGNOSIS — I889 Nonspecific lymphadenitis, unspecified: Secondary | ICD-10-CM

## 2021-03-20 DIAGNOSIS — L732 Hidradenitis suppurativa: Secondary | ICD-10-CM

## 2021-03-20 DIAGNOSIS — F17210 Nicotine dependence, cigarettes, uncomplicated: Secondary | ICD-10-CM | POA: Insufficient documentation

## 2021-03-20 DIAGNOSIS — L040 Acute lymphadenitis of face, head and neck: Secondary | ICD-10-CM | POA: Insufficient documentation

## 2021-03-20 LAB — PREGNANCY, URINE: Preg Test, Ur: NEGATIVE

## 2021-03-20 MED ORDER — DOXYCYCLINE MONOHYDRATE 100 MG PO TABS: 100.0000 mg | ORAL_TABLET | Freq: Two times a day (BID) | ORAL | 0 refills | Status: DC

## 2021-03-20 NOTE — ED Notes (Signed)
Lab called to add on urine preg  

## 2021-03-20 NOTE — ED Triage Notes (Signed)
Pt to ED from home c/o knots to her scalp for a couple weeks gradually getting worse and pain radiating into headache and neck.  Pt denies fevers, states some pain with turning head.  Pt also states has rash and bumps to left armpit and breast and waistline.

## 2021-03-20 NOTE — Discharge Instructions (Signed)
Follow-up with ear nose and throat if the swelling in your neck does not get better with antibiotics Return emergency department worsening Take the antibiotics as prescribed.  Be careful in the sun as this can make you burn if you are in direct sunlight

## 2021-03-20 NOTE — ED Provider Notes (Signed)
[facility] Emergency Department Provider Note  ____________________________________________   Event Date/Time   First MD Initiated Contact with Patient 03/20/21 [phone]     (approximate)  I have reviewed the triage vital signs and the nursing notes.   HISTORY  Chief Complaint Headache    HPI Olivia Alexander is a 35 y.o. female presents emergency department complaining of bumps all over her head, underneath her arms and underneath her abdomen.  States these have happened in the past.  Has swollen area on side of her neck's been there for at least 2 years.  No fever or chills.  No chest pain or shortness of breath.  Would also like to know if she is pregnant  History reviewed. No pertinent past medical history.  There are no problems to display for this patient.   Past Surgical History:  Procedure Laterality Date   CESAREAN SECTION      Prior to Admission medications   Medication Sig Start Date End Date Taking? Authorizing Provider  doxycycline (ADOXA) 100 MG tablet Take 1 tablet (100 mg total) by mouth 2 (two) times daily. 03/20/21  Yes Dyron Kawano, Roselyn Bering, PA-C  metroNIDAZOLE (FLAGYL) 500 MG tablet Take 1 tablet (500 mg total) by mouth 2 (two) times daily. 06/28/19   Renford Dills, NP  FLUoxetine (PROZAC) 20 MG tablet Take 20 mg by mouth daily.  06/28/19  [provider]    Allergies Patient has no known allergies.  Family History  Problem Relation Age of Onset   Healthy Mother    Hypertension Father     Social History Social History   Tobacco Use   Smoking status: Every Day    Packs/day: 1.00    Types: Cigarettes   Smokeless tobacco: Never  Vaping Use   Vaping Use: Never used  Substance Use Topics   Alcohol use: Yes   Drug use: Not Currently    Types: Marijuana    Comment: crack    Review of Systems  Constitutional: No fever/chills Eyes: No visual changes. ENT: No sore throat. Respiratory: Denies cough Cardiovascular:  Denies chest pain Gastrointestinal: Denies abdominal pain Genitourinary: Negative for dysuria. Musculoskeletal: Negative for back pain. Skin: Positive for rash. Psychiatric: no mood changes,     ____________________________________________   PHYSICAL EXAM:  VITAL SIGNS: ED Triage Vitals  Enc Vitals Group     BP 03/20/21 0345 (!) 175/92     Pulse Rate 03/20/21 0345 89     Resp 03/20/21 0345 16     Temp 03/20/21 0345 98.4 F (36.9 C)     Temp Source 03/20/21 0345 Oral     SpO2 03/20/21 0345 99 %     Weight 03/20/21 0349 238 lb (108 kg)     Height 03/20/21 0349 5\' 1"  (1.549 m)     Head Circumference --      Peak Flow --      Pain Score 03/20/21 0346 6     Pain Loc --      Pain Edu? --      Excl. in [place]? --     Constitutional: Alert and oriented. Well appearing and in no acute distress. Eyes: Conjunctivae are normal.  Head: Atraumatic.  Small bumps like abscesses noted in the scalp Nose: No congestion/rhinnorhea. Mouth/Throat: Mucous membranes are moist.   Neck:  supple large lymph node noted on the left side of cervical chain Cardiovascular: Normal rate, regular rhythm. Heart sounds are normal Respiratory: Normal respiratory effort.  No retractions, lungs  c t a  Abd: soft nontender bs normal all 4 quad, scars from abscesses/hidradenitis noted under the pannus GU: deferred Musculoskeletal: FROM all extremities, warm and well perfused Neurologic:  Normal speech and language.  Skin:  Skin is warm, dry and intact. No rash noted.  Multiple abscesses noted typical of hidradenitis, in the axillas under the pannus Psychiatric: Mood and affect are normal. Speech and behavior are normal.  ____________________________________________   LABS (all labs ordered are listed, but only abnormal results are displayed)  Labs Reviewed  PREGNANCY, URINE    ____________________________________________   ____________________________________________  RADIOLOGY    ____________________________________________   PROCEDURES  Procedure(s) performed: No  Procedures    ____________________________________________   INITIAL IMPRESSION / ASSESSMENT AND PLAN / ED COURSE  Pertinent labs & imaging results that were available during my care of the patient were reviewed by me and considered in my medical decision making (see chart for details).   Patient's 35 year old female presents emergency department with headache and abscesses.  See HPI.  Physical exam shows patient appears stable.  Feel like the headache is coming mostly from the number of abscess that she has along with muscle spasm to the left side neck due to lymphadenopathy.  We will do a trial of antibiotic.  If she is not improving she will need to follow-up with ENT to have the lymph node evaluated.  Dermatology for her skin disorder.  Urine pregnancy ordered  Urine pregnancy is negative  Did explain all the findings to the patient.  She was given a prescription for doxycycline.  She is to follow-up with ENT if the swelling in her neck has not gotten better in 1 to 2 weeks.  Return emergency department worsening.  Follow-up with dermatology.  Discharged in stable condition    Olivia Alexander was evaluated in Emergency Department on 03/20/2021 for the symptoms described in the history of present illness. She was evaluated in the context of the global COVID-19 pandemic, which necessitated consideration that the patient might be at risk for infection with the SARS-CoV-2 virus that causes COVID-19. Institutional protocols and algorithms that pertain to the evaluation of patients at risk for COVID-19 are in a state of rapid change based on information released by regulatory bodies including the CDC and federal and state organizations. These policies and algorithms were followed during  the patient's care in the ED.    As part of my medical decision making, I reviewed the following data within the electronic MEDICAL RECORD NUMBER History obtained from family, Nursing notes reviewed and incorporated, Labs reviewed , Old chart reviewed, Notes from prior ED visits, and [place] Controlled Substance Database  ____________________________________________   FINAL CLINICAL IMPRESSION(S) / ED DIAGNOSES  Final diagnoses:  Hidradenitis  Cervical lymphadenitis      NEW MEDICATIONS STARTED DURING THIS VISIT:  New Prescriptions   DOXYCYCLINE (ADOXA) 100 MG TABLET    Take 1 tablet (100 mg total) by mouth 2 (two) times daily.     Note:  This document was prepared using Dragon voice recognition software and may include unintentional dictation errors.    Faythe Ghee, PA-C 03/20/21 0830    Arnaldo Natal, MD 03/20/21 1537

## 2021-05-20 ENCOUNTER — Emergency Department: Payer: Self-pay

## 2021-05-20 ENCOUNTER — Other Ambulatory Visit: Payer: Self-pay

## 2021-05-20 ENCOUNTER — Emergency Department
Admission: EM | Admit: 2021-05-20 | Discharge: 2021-05-20 | Disposition: A | Discharge status: Home / Self Care | Payer: Self-pay | Attending: Emergency Medicine | Admitting: Emergency Medicine

## 2021-05-20 DIAGNOSIS — N939 Abnormal uterine and vaginal bleeding, unspecified: Secondary | ICD-10-CM | POA: Insufficient documentation

## 2021-05-20 DIAGNOSIS — R1031 Right lower quadrant pain: Secondary | ICD-10-CM

## 2021-05-20 DIAGNOSIS — F17210 Nicotine dependence, cigarettes, uncomplicated: Secondary | ICD-10-CM | POA: Insufficient documentation

## 2021-05-20 DIAGNOSIS — N926 Irregular menstruation, unspecified: Secondary | ICD-10-CM | POA: Insufficient documentation

## 2021-05-20 LAB — COMPREHENSIVE METABOLIC PANEL
ALT: 15 U/L (ref 0–44)
AST: 15 U/L (ref 15–41)
Albumin: 3.4 g/dL — ABNORMAL LOW (ref 3.5–5.0)
Alkaline Phosphatase: 63 U/L (ref 38–126)
Anion gap: 4 — ABNORMAL LOW (ref 5–15)
BUN: 12 mg/dL (ref 6–20)
CO2: 27 mmol/L (ref 22–32)
Calcium: 8.3 mg/dL — ABNORMAL LOW (ref 8.9–10.3)
Chloride: 106 mmol/L (ref 98–111)
Creatinine, Ser: 0.47 mg/dL (ref 0.44–1.00)
GFR, Estimated: >60 mL/min (ref >60)
Glucose, Bld: 124 mg/dL — ABNORMAL HIGH (ref 70–99)
Potassium: 3.6 mmol/L (ref 3.5–5.1)
Sodium: 137 mmol/L (ref 135–145)
Total Bilirubin: 0.4 mg/dL (ref 0.3–1.2)
Total Protein: 7.1 g/dL (ref 6.5–8.1)

## 2021-05-20 LAB — CBC WITH DIFFERENTIAL/PLATELET
Abs Immature Granulocytes: 0.03 10*3/uL (ref 0.00–0.07)
Basophils Absolute: 0.1 10*3/uL (ref 0.0–0.1)
Basophils Relative: 1 %
Eosinophils Absolute: 0.2 10*3/uL (ref 0.0–0.5)
Eosinophils Relative: 2 %
HCT: 41.8 % (ref 36.0–46.0)
Hemoglobin: 13.3 g/dL (ref 12.0–15.0)
Immature Granulocytes: 0 %
Lymphocytes Relative: 27 %
Lymphs Abs: 2.8 10*3/uL (ref 0.7–4.0)
MCH: 26.4 pg (ref 26.0–34.0)
MCHC: 31.8 g/dL (ref 30.0–36.0)
MCV: 82.9 fL (ref 80.0–100.0)
Monocytes Absolute: 0.8 10*3/uL (ref 0.1–1.0)
Monocytes Relative: 8 %
Neutro Abs: 6.4 10*3/uL (ref 1.7–7.7)
Neutrophils Relative %: 62 %
Platelets: 300 10*3/uL (ref 150–400)
RBC: 5.04 MIL/uL (ref 3.87–5.11)
RDW: 14.6 % (ref 11.5–15.5)
WBC: 10.3 10*3/uL (ref 4.0–10.5)
nRBC: 0 % (ref 0.0–0.2)

## 2021-05-20 LAB — HCG, QUANTITATIVE, PREGNANCY: hCG, Beta Chain, Quant, S: <1 m[IU]/mL (ref <5)

## 2021-05-20 LAB — POC URINE PREG, ED: Preg Test, Ur: NEGATIVE

## 2021-05-20 NOTE — Discharge Instructions (Signed)
Your work-up is reassuring.  Your ultrasound was negative and there was no evidence of pregnancy.  Return to the ER if you develop worsening pain, fevers, bleeding more than a pad an hour or any other concerns

## 2021-05-20 NOTE — ED Provider Notes (Signed)
[facility] Emergency Department Provider Note  ____________________________________________   Event Date/Time   First MD Initiated Contact with Patient 05/20/21 0813     (approximate)  I have reviewed the triage vital signs and the nursing notes.   HISTORY  Chief Complaint Vaginal Bleeding    HPI Olivia Alexander is a 35 y.o. female G2, P1 who comes in with concerns for vaginal bleeding.  Patient reports that she missed her period last month.  She is worried that she could be pregnant.  She states that she had sex 2 days ago and had a little bit of bleeding afterwards.  However then yesterday she developed worsening bleeding and more pain in the right lower quadrant, intermittent, nothing makes it better or worse.  She states that the bleeding is now heavier with some clots and she wanted to be evaluated.  Denies any vaginal discharge or other concerns.  Patient reports 1 prior miscarriage and 1 prior live birth due to C-section    History reviewed. No pertinent past medical history.  There are no problems to display for this patient.   Past Surgical History:  Procedure Laterality Date   CESAREAN SECTION      Prior to Admission medications   Medication Sig Start Date End Date Taking? Authorizing Provider  doxycycline (ADOXA) 100 MG tablet Take 1 tablet (100 mg total) by mouth 2 (two) times daily. 03/20/21   Fisher, Roselyn Bering, PA-C  metroNIDAZOLE (FLAGYL) 500 MG tablet Take 1 tablet (500 mg total) by mouth 2 (two) times daily. 06/28/19   Renford Dills, NP  FLUoxetine (PROZAC) 20 MG tablet Take 20 mg by mouth daily.  06/28/19  [provider]    Allergies Patient has no known allergies.  Family History  Problem Relation Age of Onset   Healthy Mother    Hypertension Father     Social History Social History   Tobacco Use   Smoking status: Every Day    Packs/day: 1.00    Types: Cigarettes   Smokeless tobacco: Never  Vaping Use    Vaping Use: Never used  Substance Use Topics   Alcohol use: Yes   Drug use: Not Currently    Types: Marijuana    Comment: crack      Review of Systems Constitutional: No fever/chills Eyes: No visual changes. ENT: No sore throat. Cardiovascular: Denies chest pain. Respiratory: Denies shortness of breath. Gastrointestinal: Lower abdominal pain.  No nausea, no vomiting.  No diarrhea.  No constipation. Genitourinary: Negative for dysuria.  Vaginal bleeding Musculoskeletal: Negative for back pain. Skin: Negative for rash. Neurological: Negative for headaches, focal weakness or numbness. All other ROS negative ____________________________________________   PHYSICAL EXAM:  VITAL SIGNS: ED Triage Vitals  Enc Vitals Group     BP 05/20/21 0750 (!) 141/87     Pulse Rate 05/20/21 0750 92     Resp 05/20/21 0750 17     Temp 05/20/21 0754 98.4 F (36.9 C)     Temp Source 05/20/21 0750 Oral     SpO2 05/20/21 0750 98 %     Weight 05/20/21 0751 230 lb (104.3 kg)     Height 05/20/21 0751 5\' 1"  (1.549 m)     Head Circumference --      Peak Flow --      Pain Score 05/20/21 0751 6     Pain Loc --      Pain Edu? --      Excl. in [place]? --  Constitutional: Alert and oriented. Well appearing and in no acute distress. Eyes: Conjunctivae are normal. EOMI. Head: Atraumatic. Nose: No congestion/rhinnorhea. Mouth/Throat: Mucous membranes are moist.   Neck: No stridor. Trachea Midline. FROM Cardiovascular: Normal rate, regular rhythm. Grossly normal heart sounds.  Good peripheral circulation. Respiratory: Normal respiratory effort.  No retractions. Lungs CTAB. Gastrointestinal: Soft and nontender. No distention. No abdominal bruits.  Little bit of right lower pelvic pain Musculoskeletal: No lower extremity tenderness nor edema.  No joint effusions. Neurologic:  Normal speech and language. No gross focal neurologic deficits are appreciated.  Skin:  Skin is warm, dry and intact. No rash  noted. Psychiatric: Mood and affect are normal. Speech and behavior are normal. GU: Deferred   ____________________________________________   LABS (all labs ordered are listed, but only abnormal results are displayed)  Labs Reviewed  COMPREHENSIVE METABOLIC PANEL - Abnormal; Notable for the following components:      Result Value   Glucose, Bld 124 (*)    Calcium 8.3 (*)    Albumin 3.4 (*)    Anion gap 4 (*)    All other components within normal limits  CBC WITH DIFFERENTIAL/PLATELET  HCG, QUANTITATIVE, PREGNANCY  POC URINE PREG, ED   ____________________________________________ RADIOLOGY   Official radiology report(s): US PELVIC COMPLETE W TRANSVAGINAL AND TORSION R/O  Result Date: 05/20/2021 CLINICAL DATA:  Right lower quadrant abdominal pain for 1 day EXAM: TRANSABDOMINAL AND TRANSVAGINAL ULTRASOUND OF PELVIS DOPPLER ULTRASOUND OF OVARIES TECHNIQUE: Both transabdominal and transvaginal ultrasound examinations of the pelvis were performed. Transabdominal technique was performed for global imaging of the pelvis including uterus, ovaries, adnexal regions, and pelvic cul-de-sac. It was necessary to proceed with endovaginal exam following the transabdominal exam to visualize the uterus, endometrium, and ovaries. Color and duplex Doppler ultrasound was utilized to evaluate blood flow to the ovaries. COMPARISON:  None. FINDINGS: Uterus Measurements: 8.8 x 3.9 x 4.4 cm = volume: 78 mL. No fibroids or other mass visualized. Endometrium Thickness: 8 mm.  No focal abnormality visualized. Right ovary Measurements: 3.5 x 2.3 x 2.2 cm = volume: 9 mL. Normal appearance/no adnexal mass. Small follicles. Left ovary Measurements: 2.8 x 1.7 x 3.2 cm = volume: 8 mL. Normal appearance/no adnexal mass. Small follicles. Pulsed Doppler evaluation of both ovaries demonstrates normal low-resistance arterial and venous waveforms. Other findings No abnormal free fluid. IMPRESSION: 1. No ultrasound findings of  the pelvis to explain right lower quadrant pain. Consider CT or MRI to further evaluate otherwise unexplained abdominal pain. 2. Normal size and appearance of the ovaries. Symmetric arterial and venous Doppler flow is present bilaterally. Electronically Signed   By: Jearld Lesch M.D.   On: 05/20/2021 10:05    ____________________________________________   PROCEDURES  Procedure(s) performed (including Critical Care):  Procedures   ____________________________________________   INITIAL IMPRESSION / ASSESSMENT AND PLAN / ED COURSE  Olivia Alexander was evaluated in Emergency Department on 05/20/2021 for the symptoms described in the history of present illness. She was evaluated in the context of the global COVID-19 pandemic, which necessitated consideration that the patient might be at risk for infection with the SARS-CoV-2 virus that causes COVID-19. Institutional protocols and algorithms that pertain to the evaluation of patients at risk for COVID-19 are in a state of rapid change based on information released by regulatory bodies including the CDC and federal and state organizations. These policies and algorithms were followed during the patient's care in the ED.    Patient comes in with vaginal bleeding and R Lower  abdominal pain.  Patient reports being worried that she might be having a miscarriage.  Labs will be ordered evaluate for any anemia.  Pregnancy test to evaluate for ectopic, miscarriage.  Pregnancy test was negative.  Patient is reporting pain on the right side therefore will get ultrasound to make sure no evidence of torsion, cyst rupture or other acute pathology.  Pregnancy test was negative and this was confirmed with hCG.  Her labs are all reassuring with normal hemoglobin.  Her ultrasound was negative.  On repeat abdominal exam she is soft and nontender.  She reports her pain is since resolved.  We CT imaging to evaluate for appendicitis given the pain was right lower quadrant  but she has no fever, no white count and her pain is now resolved so she would like to hold off.  She states that her vaginal bleeding is very minimal in nature at this time.  Offered her pelvic to test for STDs versus to evaluate for the amount of bleeding.  Patient declined denies any concerns for this.  She denies any urinary symptoms.  I suspect that this is pain was from her menstruation.  Unclear why her menstruations are abnormal.  Denies any estrogen use.  We will give her OB follow-up        ____________________________________________   FINAL CLINICAL IMPRESSION(S) / ED DIAGNOSES   Final diagnoses:  RLQ abdominal pain  Vaginal bleeding  Menstruation, abnormal      MEDICATIONS GIVEN DURING THIS VISIT:  Medications - No data to display   ED Discharge Orders     None        Note:  This document was prepared using Dragon voice recognition software and may include unintentional dictation errors.    Concha Se, MD 05/20/21 1022

## 2021-05-20 NOTE — ED Triage Notes (Signed)
Pt states she has not had a period in 2 months and started having heavy vaginal bleeding yesterday passing clots with lower abd cramping.

## 2021-07-22 ENCOUNTER — Ambulatory Visit: Payer: Medicaid Other

## 2022-02-22 ENCOUNTER — Encounter: Payer: Self-pay | Admitting: Emergency Medicine

## 2022-02-22 ENCOUNTER — Emergency Department
Admission: EM | Admit: 2022-02-22 | Discharge: 2022-02-22 | Disposition: A | Discharge status: Home / Self Care | Payer: Medicaid Other | Attending: Student in an Organized Health Care Education/Training Program | Admitting: Student in an Organized Health Care Education/Training Program

## 2022-02-22 ENCOUNTER — Other Ambulatory Visit: Payer: Self-pay

## 2022-02-22 DIAGNOSIS — J029 Acute pharyngitis, unspecified: Secondary | ICD-10-CM

## 2022-02-22 DIAGNOSIS — K0889 Other specified disorders of teeth and supporting structures: Secondary | ICD-10-CM | POA: Insufficient documentation

## 2022-02-22 DIAGNOSIS — Z20822 Contact with and (suspected) exposure to covid-19: Secondary | ICD-10-CM | POA: Insufficient documentation

## 2022-02-22 LAB — BASIC METABOLIC PANEL
Anion gap: 9 (ref 5–15)
BUN: 11 mg/dL (ref 6–20)
CO2: 23 mmol/L (ref 22–32)
Calcium: 8 mg/dL — ABNORMAL LOW (ref 8.9–10.3)
Chloride: 105 mmol/L (ref 98–111)
Creatinine, Ser: 0.78 mg/dL (ref 0.44–1.00)
GFR, Estimated: >60 mL/min (ref >60)
Glucose, Bld: 216 mg/dL — ABNORMAL HIGH (ref 70–99)
Potassium: 3.3 mmol/L — ABNORMAL LOW (ref 3.5–5.1)
Sodium: 137 mmol/L (ref 135–145)

## 2022-02-22 LAB — CBC
HCT: 40.4 % (ref 36.0–46.0)
Hemoglobin: 12.8 g/dL (ref 12.0–15.0)
MCH: 26.3 pg (ref 26.0–34.0)
MCHC: 31.7 g/dL (ref 30.0–36.0)
MCV: 83.1 fL (ref 80.0–100.0)
Platelets: 266 10*3/uL (ref 150–400)
RBC: 4.86 MIL/uL (ref 3.87–5.11)
RDW: 15.5 % (ref 11.5–15.5)
WBC: 11.3 10*3/uL — ABNORMAL HIGH (ref 4.0–10.5)
nRBC: 0 % (ref 0.0–0.2)

## 2022-02-22 LAB — GROUP A STREP BY PCR: Group A Strep by PCR: NOT DETECTED

## 2022-02-22 LAB — SARS CORONAVIRUS 2 BY RT PCR: SARS Coronavirus 2 by RT PCR: NEGATIVE

## 2022-02-22 MED ORDER — CLINDAMYCIN HCL 300 MG PO CAPS: 300.0000 mg | ORAL_CAPSULE | Freq: Three times a day (TID) | ORAL | 0 refills | Status: AC

## 2022-02-22 MED ORDER — DEXAMETHASONE 4 MG PO TABS
10.0000 mg | ORAL_TABLET | Freq: Once | ORAL | Status: AC
  Administered 2022-02-22: 10 mg via ORAL
  Filled 2022-02-22: qty 3

## 2022-02-22 MED ORDER — CLINDAMYCIN HCL 150 MG PO CAPS
300.0000 mg | ORAL_CAPSULE | Freq: Once | ORAL | Status: AC
  Administered 2022-02-22: 300 mg via ORAL
  Filled 2022-02-22: qty 2

## 2022-02-22 NOTE — ED Triage Notes (Signed)
Pt here with dental complications. Pt went to get her tooth pulled on her left top mouth yesterday, pt states they were not able to pull it. Pt states she was given an abx yesterday and she felt like her throat was closing up and she got really hot. Pt also state she saw bright red blood in the toilet after having a bowel movement.

## 2022-02-22 NOTE — ED Provider Notes (Signed)
[facility] Provider Note    Event Date/Time   First MD Initiated Contact with Patient 02/22/22 1355     (approximate)   History   Dental Problem   HPI  Megham Abbett is a 36 y.o. female who presents to the ER for evaluation of dental pain as well as sore throat itching that started last night after taking a dose of amoxicillin.  She was at the dental office for dental extraction yesterday they were unable to complete this due to dental infection.  She is never had issues with penicillins before but felt like after she took the pill she started having side effects.  Feels like the symptoms are improving.  No trouble swallowing.  Normal phonation.     Physical Exam   Triage Vital Signs: ED Triage Vitals  Enc Vitals Group     BP 02/22/22 1236 (!) 160/91     Pulse Rate 02/22/22 1236 96     Resp 02/22/22 1236 18     Temp 02/22/22 1236 98.2 F (36.8 C)     Temp Source 02/22/22 1236 Oral     SpO2 02/22/22 1236 98 %     Weight 02/22/22 1237 229 lb 15 oz (104.3 kg)     Height 02/22/22 1237 5\' 1"  (1.549 m)     Head Circumference --      Peak Flow --      Pain Score 02/22/22 1237 5     Pain Loc --      Pain Edu? --      Excl. in [place]? --     Most recent vital signs: Vitals:   02/22/22 1236  BP: (!) 160/91  Pulse: 96  Resp: 18  Temp: 98.2 F (36.8 C)  SpO2: 98%     Constitutional: Alert  Eyes: Conjunctivae are normal.  Head: Atraumatic. Nose: No congestion/rhinnorhea. Mouth/Throat: Mucous membranes are moist.  Uvula is midline.  Does have bilateral tonsillar erythema and trace exudate no findings to suggest PTA or RPA.  No trismus.  No Ludwigs Neck: Painless ROM.  Cardiovascular:   Good peripheral circulation. Respiratory: Normal respiratory effort.  No retractions.  Gastrointestinal: Soft and nontender.  Musculoskeletal:  no deformity Neurologic:  MAE spontaneously. No gross focal neurologic deficits are appreciated.  Skin:  Skin is warm,  dry and intact. No rash noted. Psychiatric: Mood and affect are normal. Speech and behavior are normal.    ED Results / Procedures / Treatments   Labs (all labs ordered are listed, but only abnormal results are displayed) Labs Reviewed  CBC - Abnormal; Notable for the following components:      Result Value   WBC 11.3 (*)    All other components within normal limits  BASIC METABOLIC PANEL - Abnormal; Notable for the following components:   Potassium 3.3 (*)    Glucose, Bld 216 (*)    Calcium 8.0 (*)    All other components within normal limits  SARS CORONAVIRUS 2 BY RT PCR  GROUP A STREP BY PCR     EKG     RADIOLOGY    PROCEDURES:  Critical Care performed:   Procedures   MEDICATIONS ORDERED IN ED: Medications  dexamethasone (DECADRON) tablet 10 mg (10 mg Oral Given 02/22/22 1411)  clindamycin (CLEOCIN) capsule 300 mg (300 mg Oral Given 02/22/22 1410)     IMPRESSION / MDM / ASSESSMENT AND PLAN / ED COURSE  I reviewed the triage vital signs and the nursing notes.  Differential diagnosis includes, but is not limited to, allergic reaction, pharyngitis, dental abscess, PTA, RPA, Ludewig's angina, anaphylaxis, epiglottitis  Presented to the ER for evaluation of symptoms as described above.  She is well-appearing nontoxic exam as above.  Possible allergic reaction but her symptoms seem to be improving seems less consistent with anaphylaxis.  Also findings are concerning for.  Pharyngitis but is already received dose of antibiotics.  Given possible reaction to amoxicillin will switch to clindamycin.  She is tolerating p.o.  Will give Decadron.  She also might have some irritation and swelling just from the procedure but do not feel that advanced imaging clinically indicated with this presentation.  Patient was observed in the ER after symptomatic management feels like her symptoms are improving I do believe she stable and appropriate for  outpatient follow-up.      FINAL CLINICAL IMPRESSION(S) / ED DIAGNOSES   Final diagnoses:  Pain, dental  Acute pharyngitis, unspecified etiology     Rx / DC Orders   ED Discharge Orders          Ordered    clindamycin (CLEOCIN) 300 MG capsule  3 times daily        02/22/22 1510             Note:  This document was prepared using Dragon voice recognition software and may include unintentional dictation errors.    Willy Eddy, MD 02/22/22 1517

## 2022-02-22 NOTE — ED Triage Notes (Signed)
First nurse Note:  Had an attempted tooth extraction yesterday.. unsuccessful.  Started on Amoxicillin. Went home after procedure, felt that left side of face was red and numb, felt throat tightness, and noticed blood in stool.  Arrives today with c/o still feeling a little funny in throat and seeing blood in stool.  AAOx3.  Skin warm and dry. No SOB/DOE.  Voice clear and strong.  NAD

## 2022-08-08 ENCOUNTER — Telehealth: Payer: Self-pay

## 2022-08-08 NOTE — Telephone Encounter (Signed)
Mychart msg sent

## 2022-08-16 ENCOUNTER — Other Ambulatory Visit: Payer: Self-pay

## 2022-08-16 ENCOUNTER — Encounter: Payer: Self-pay | Admitting: *Deleted

## 2022-08-16 ENCOUNTER — Emergency Department
Admission: EM | Admit: 2022-08-16 | Discharge: 2022-08-16 | Discharge status: Against Medical Advice | Payer: Medicaid Other | Attending: Emergency Medicine | Admitting: Emergency Medicine

## 2022-08-16 DIAGNOSIS — Z3A00 Weeks of gestation of pregnancy not specified: Secondary | ICD-10-CM | POA: Diagnosis not present

## 2022-08-16 DIAGNOSIS — O209 Hemorrhage in early pregnancy, unspecified: Secondary | ICD-10-CM | POA: Insufficient documentation

## 2022-08-16 DIAGNOSIS — Z5321 Procedure and treatment not carried out due to patient leaving prior to being seen by health care provider: Secondary | ICD-10-CM | POA: Diagnosis not present

## 2022-08-16 LAB — CBC
HCT: 42 % (ref 36.0–46.0)
Hemoglobin: 13.6 g/dL (ref 12.0–15.0)
MCH: 26.9 pg (ref 26.0–34.0)
MCHC: 32.4 g/dL (ref 30.0–36.0)
MCV: 83 fL (ref 80.0–100.0)
Platelets: 269 10*3/uL (ref 150–400)
RBC: 5.06 MIL/uL (ref 3.87–5.11)
RDW: 14.6 % (ref 11.5–15.5)
WBC: 7.3 10*3/uL (ref 4.0–10.5)
nRBC: 0 % (ref 0.0–0.2)

## 2022-08-16 LAB — URINALYSIS, ROUTINE W REFLEX MICROSCOPIC
Bilirubin Urine: NEGATIVE
Glucose, UA: >=500 mg/dL — AB
Ketones, ur: NEGATIVE mg/dL
Leukocytes,Ua: NEGATIVE
Nitrite: NEGATIVE
Protein, ur: NEGATIVE mg/dL
Specific Gravity, Urine: 1.015 (ref 1.005–1.030)
pH: 6 (ref 5.0–8.0)

## 2022-08-16 LAB — COMPREHENSIVE METABOLIC PANEL
ALT: 13 U/L (ref 0–44)
AST: 17 U/L (ref 15–41)
Albumin: 3.2 g/dL — ABNORMAL LOW (ref 3.5–5.0)
Alkaline Phosphatase: 67 U/L (ref 38–126)
Anion gap: 10 (ref 5–15)
BUN: 9 mg/dL (ref 6–20)
CO2: 24 mmol/L (ref 22–32)
Calcium: 8.6 mg/dL — ABNORMAL LOW (ref 8.9–10.3)
Chloride: 102 mmol/L (ref 98–111)
Creatinine, Ser: 0.47 mg/dL (ref 0.44–1.00)
GFR, Estimated: >60 mL/min (ref >60)
Glucose, Bld: 212 mg/dL — ABNORMAL HIGH (ref 70–99)
Potassium: 3.7 mmol/L (ref 3.5–5.1)
Sodium: 136 mmol/L (ref 135–145)
Total Bilirubin: 0.4 mg/dL (ref 0.3–1.2)
Total Protein: 6.8 g/dL (ref 6.5–8.1)

## 2022-08-16 LAB — HCG, QUANTITATIVE, PREGNANCY: hCG, Beta Chain, Quant, S: 3299 m[IU]/mL — ABNORMAL HIGH (ref <5)

## 2022-08-16 LAB — ABO/RH: ABO/RH(D): A POS

## 2022-08-16 LAB — POC URINE PREG, ED: Preg Test, Ur: POSITIVE — AB

## 2022-08-16 NOTE — ED Triage Notes (Signed)
Pt reports vaginal bleeding with abd pain and cramping. Sx began last night.  Home pregnacy test positive in Feb.  Pt has not seen a doctor yet

## 2022-08-17 DIAGNOSIS — Z3A10 10 weeks gestation of pregnancy: Secondary | ICD-10-CM | POA: Insufficient documentation

## 2022-08-17 DIAGNOSIS — R000 Tachycardia, unspecified: Secondary | ICD-10-CM | POA: Diagnosis not present

## 2022-08-17 DIAGNOSIS — O039 Complete or unspecified spontaneous abortion without complication: Secondary | ICD-10-CM | POA: Diagnosis not present

## 2022-08-17 DIAGNOSIS — Z3A01 Less than 8 weeks gestation of pregnancy: Secondary | ICD-10-CM | POA: Diagnosis not present

## 2022-08-17 DIAGNOSIS — O209 Hemorrhage in early pregnancy, unspecified: Secondary | ICD-10-CM | POA: Diagnosis not present

## 2022-08-17 NOTE — ED Triage Notes (Signed)
Pt to triage via w/c, appears uncomfortable; Pt reports miscarriage last night (approx [redacted]wks pregnant); now with severe abd cramping and bleeding

## 2022-08-18 ENCOUNTER — Emergency Department
Admission: EM | Admit: 2022-08-18 | Discharge: 2022-08-18 | Disposition: A | Discharge status: Home / Self Care | Payer: Medicaid Other | Attending: Emergency Medicine | Admitting: Emergency Medicine

## 2022-08-18 ENCOUNTER — Other Ambulatory Visit: Payer: Self-pay

## 2022-08-18 ENCOUNTER — Emergency Department: Payer: Medicaid Other

## 2022-08-18 ENCOUNTER — Encounter: Payer: Self-pay | Admitting: Emergency Medicine

## 2022-08-18 DIAGNOSIS — O209 Hemorrhage in early pregnancy, unspecified: Secondary | ICD-10-CM | POA: Diagnosis not present

## 2022-08-18 DIAGNOSIS — O039 Complete or unspecified spontaneous abortion without complication: Secondary | ICD-10-CM

## 2022-08-18 DIAGNOSIS — Z3A01 Less than 8 weeks gestation of pregnancy: Secondary | ICD-10-CM | POA: Diagnosis not present

## 2022-08-18 LAB — CBC WITH DIFFERENTIAL/PLATELET
Abs Immature Granulocytes: 0.03 10*3/uL (ref 0.00–0.07)
Basophils Absolute: 0.1 10*3/uL (ref 0.0–0.1)
Basophils Relative: 0 %
Eosinophils Absolute: 0.3 10*3/uL (ref 0.0–0.5)
Eosinophils Relative: 2 %
HCT: 42.2 % (ref 36.0–46.0)
Hemoglobin: 13.5 g/dL (ref 12.0–15.0)
Immature Granulocytes: 0 %
Lymphocytes Relative: 17 %
Lymphs Abs: 2.4 10*3/uL (ref 0.7–4.0)
MCH: 26.9 pg (ref 26.0–34.0)
MCHC: 32 g/dL (ref 30.0–36.0)
MCV: 84.2 fL (ref 80.0–100.0)
Monocytes Absolute: 0.8 10*3/uL (ref 0.1–1.0)
Monocytes Relative: 5 %
Neutro Abs: 10.6 10*3/uL — ABNORMAL HIGH (ref 1.7–7.7)
Neutrophils Relative %: 76 %
Platelets: 273 10*3/uL (ref 150–400)
RBC: 5.01 MIL/uL (ref 3.87–5.11)
RDW: 14.6 % (ref 11.5–15.5)
WBC: 14.1 10*3/uL — ABNORMAL HIGH (ref 4.0–10.5)
nRBC: 0 % (ref 0.0–0.2)

## 2022-08-18 LAB — COMPREHENSIVE METABOLIC PANEL
ALT: 14 U/L (ref 0–44)
AST: 21 U/L (ref 15–41)
Albumin: 3.3 g/dL — ABNORMAL LOW (ref 3.5–5.0)
Alkaline Phosphatase: 64 U/L (ref 38–126)
Anion gap: 10 (ref 5–15)
BUN: 9 mg/dL (ref 6–20)
CO2: 20 mmol/L — ABNORMAL LOW (ref 22–32)
Calcium: 8.4 mg/dL — ABNORMAL LOW (ref 8.9–10.3)
Chloride: 105 mmol/L (ref 98–111)
Creatinine, Ser: 0.63 mg/dL (ref 0.44–1.00)
GFR, Estimated: >60 mL/min (ref >60)
Glucose, Bld: 140 mg/dL — ABNORMAL HIGH (ref 70–99)
Potassium: 4 mmol/L (ref 3.5–5.1)
Sodium: 135 mmol/L (ref 135–145)
Total Bilirubin: 0.4 mg/dL (ref 0.3–1.2)
Total Protein: 7.3 g/dL (ref 6.5–8.1)

## 2022-08-18 MED ORDER — OXYCODONE-ACETAMINOPHEN 5-325 MG PO TABS: 1.0000 | ORAL_TABLET | ORAL | 0 refills | Status: DC | PRN

## 2022-08-18 MED ORDER — HYDROMORPHONE HCL 1 MG/ML IJ SOLN
0.5000 mg | Freq: Once | INTRAMUSCULAR | Status: AC
  Administered 2022-08-18: 0.5 mg via INTRAVENOUS
  Filled 2022-08-18: qty 0.5

## 2022-08-18 MED ORDER — KETOROLAC TROMETHAMINE 30 MG/ML IJ SOLN
15.0000 mg | Freq: Once | INTRAMUSCULAR | Status: AC
  Administered 2022-08-18: 15 mg via INTRAVENOUS
  Filled 2022-08-18: qty 1

## 2022-08-18 MED ORDER — ONDANSETRON 4 MG PO TBDP: 4.0000 mg | ORAL_TABLET | Freq: Three times a day (TID) | ORAL | 0 refills | Status: DC | PRN

## 2022-08-18 MED ORDER — ONDANSETRON HCL 4 MG/2ML IJ SOLN
4.0000 mg | Freq: Once | INTRAMUSCULAR | Status: AC
  Administered 2022-08-18: 4 mg via INTRAVENOUS
  Filled 2022-08-18: qty 2

## 2022-08-18 MED ORDER — SODIUM CHLORIDE 0.9 % IV BOLUS
1000.0000 mL | Freq: Once | INTRAVENOUS | Status: AC
  Administered 2022-08-18: 1000 mL via INTRAVENOUS

## 2022-08-18 NOTE — ED Notes (Signed)
Pt A&O x4, no obvious distress noted, respirations regular/unlabored. Pt verbalizes understanding of discharge instructions. Pt able to ambulate from ED independently.   

## 2022-08-18 NOTE — Discharge Instructions (Signed)
You may take pain and nausea medicines as needed (Percocet/Zofran #15).  Return to the ER for worsening symptoms, persistent vomiting, soaking more than 1 maxi pad per hour, or other concerns.

## 2022-08-18 NOTE — ED Provider Notes (Signed)
[facility] Provider Note    Event Date/Time   First MD Initiated Contact with Patient 08/18/22 0005     (approximate)   History   Vaginal Bleeding   HPI  Olivia Alexander is a 37 y.o. female G3, P1 Ab1 approximately [redacted] weeks pregnant by dates who states she was at the [facility] ED last night and suffered a miscarriage.  Presents with sudden onset pelvic pain approximately 1 hour ago.  States she thinks she passed everything last evening and bleeding has not been that heavy.  Denies chest pain, shortness of breath, nausea/vomiting or dizziness.  Denies anticoagulant use.     Past Medical History  History reviewed. No pertinent past medical history.   Active Problem List  There are no problems to display for this patient.    Past Surgical History   Past Surgical History:  Procedure Laterality Date   CESAREAN SECTION       Home Medications   Prior to Admission medications   Medication Sig Start Date End Date Taking? Authorizing Provider  ondansetron (ZOFRAN-ODT) 4 MG disintegrating tablet Take 1 tablet (4 mg total) by mouth every 8 (eight) hours as needed for nausea or vomiting. 08/18/22  Yes Paulette Blanch, MD  oxyCODONE-acetaminophen (PERCOCET/ROXICET) 5-325 MG tablet Take 1 tablet by mouth every 4 (four) hours as needed for severe pain. 08/18/22  Yes Paulette Blanch, MD  FLUoxetine (PROZAC) 20 MG tablet Take 20 mg by mouth daily.  06/28/19  [provider]     Allergies  Patient has no known allergies.   Family History   Family History  Problem Relation Age of Onset   Healthy Mother    Hypertension Father      Physical Exam  Triage Vital Signs: ED Triage Vitals [08/17/22 2359]  Enc Vitals Group     BP (!) 179/119     Pulse Rate (!) 104     Resp (!) 24     Temp 98.2 F (36.8 C)     Temp Source Oral     SpO2 100 %     Weight 239 lb 13.8 oz (108.8 kg)     Height 5\' 1"  (1.549 m)     Head Circumference      Peak Flow      Pain  Score 10     Pain Loc      Pain Edu?      Excl. in [place]?     Updated Vital Signs: BP (!) 179/119 (BP Location: Right Arm)   Pulse (!) 104   Temp 98.2 F (36.8 C) (Oral)   Resp (!) 24   Ht 5\' 1"  (1.549 m)   Wt 108.8 kg   LMP 05/17/2022 (Approximate)   SpO2 100%   BMI 45.32 kg/m    General: Awake, moderate distress.  Screaming. CV:  Tachycardic.  Good peripheral perfusion.  Resp:  Increased effort.  CTAB. Abd:  Moderately tender lower abdomen without rebound or guarding.  No distention.  Other:  External vaginal exam does not demonstrate hemorrhage.   ED Results / Procedures / Treatments  Labs (all labs ordered are listed, but only abnormal results are displayed) Labs Reviewed  CBC WITH DIFFERENTIAL/PLATELET - Abnormal; Notable for the following components:      Result Value   WBC 14.1 (*)    Neutro Abs 10.6 (*)    All other components within normal limits  COMPREHENSIVE METABOLIC PANEL - Abnormal; Notable for the following components:  CO2 20 (*)    Glucose, Bld 140 (*)    Calcium 8.4 (*)    Albumin 3.3 (*)    All other components within normal limits     EKG  None    RADIOLOGY I have indepently visualized and interpreted patient's [place] as well as noted the radiology interpretation:  [place]: No intrauterine gestational sac, consistent with abortion in progress  Official radiology report(s): US OB LESS THAN 14 WEEKS WITH OB TRANSVAGINAL  Result Date: 08/18/2022 CLINICAL DATA:  Vaginal bleeding, pelvic cramping, positive pregnancy test. LMP 05/16/2022 EXAM: OBSTETRIC <14 WK [place] AND TRANSVAGINAL OB US TECHNIQUE: Both transabdominal and transvaginal ultrasound examinations were performed for complete evaluation of the gestation as well as the maternal uterus, adnexal regions, and pelvic cul-de-sac. Transvaginal technique was performed to assess early pregnancy. COMPARISON:  05/20/2021 FINDINGS: Intrauterine gestational sac: None identified Maternal uterus/adnexae: The  uterus is anteverted. No intrauterine masses are seen. The endometrium measures up to 16 mm in thickness within the uterine fundus and is relatively uniform in this region. Inferiorly, there is increasing heterogeneity of the endometrium extending into the endocervical canal which demonstrates funneling superiorly, best seen on image # 64. There is a large amount of heterogeneous avascular material within the endocervical canal, best seen on image # [id number], likely representing blood product the setting of abortion in progress. No free fluid within the cul-de-sac. The maternal ovaries are unremarkable. IMPRESSION: No intrauterine gestational sac identified. Dilation of the endocervical canal containing large heterogeneous avascular material most in keeping with abortion in progress. Correlation with serial beta HCG and possible sonography in 3-5 days to document complete resolution may be helpful. Electronically Signed   By: Fidela Salisbury M.D.   On: 08/18/2022 02:02     PROCEDURES:  Critical Care performed: Yes, see critical care procedure note(s)  CRITICAL CARE Performed by: Paulette Blanch   Total critical care time: 20 minutes  Critical care time was exclusive of separately billable procedures and treating other patients.  Critical care was necessary to treat or prevent imminent or life-threatening deterioration.  Critical care was time spent personally by me on the following activities: development of treatment plan with patient and/or surrogate as well as nursing, discussions with consultants, evaluation of patient's response to treatment, examination of patient, obtaining history from patient or surrogate, ordering and performing treatments and interventions, ordering and review of laboratory studies, ordering and review of radiographic studies, pulse oximetry and re-evaluation of patient's condition.   Marland Kitchen1-3 Lead EKG Interpretation  Performed by: Paulette Blanch, MD Authorized by: Paulette Blanch,  MD     Interpretation: abnormal     ECG rate:  104   ECG rate assessment: tachycardic     Rhythm: sinus tachycardia     Ectopy: none     Conduction: normal   Comments:     Patient placed on cardiac monitor to evaluate for arrhythmias    MEDICATIONS ORDERED IN ED: Medications  ketorolac (TORADOL) 30 MG/ML injection 15 mg (has no administration in time range)  HYDROmorphone (DILAUDID) injection 0.5 mg (has no administration in time range)  sodium chloride 0.9 % bolus 1,000 mL (0 mLs Intravenous Stopped 08/18/22 0146)  ondansetron (ZOFRAN) injection 4 mg (4 mg Intravenous Given 08/18/22 0027)  HYDROmorphone (DILAUDID) injection 0.5 mg (0.5 mg Intravenous Given 08/18/22 0026)     IMPRESSION / MDM / [place] / ED COURSE  I reviewed the triage vital signs and the nursing notes.  37 year old G3, P1 approximately [redacted] weeks pregnant by dates presenting with severe pelvic pain.Differential diagnosis includes, but is not limited to, ovarian cyst, ovarian torsion, acute appendicitis, diverticulitis, urinary tract infection/pyelonephritis, endometriosis, bowel obstruction, colitis, renal colic, gastroenteritis, hernia, fibroids, endometriosis, pregnancy related pain including ectopic pregnancy, etc. I have personally reviewed patient's records and see that she left without being seen from [facility] on 08/16/2022.  Beta hCG was 3299, patient is a positive blood type.  Did not stay to have ultrasound performed.  Patient's presentation is most consistent with acute presentation with potential threat to life or bodily function.  Will obtain blood work, ultrasound.  Initiate IV fluid resuscitation, IV Dilaudid for pain as patient is screaming in severe pain.  Will reassess.  Clinical Course as of 08/18/22 0237  Thu Aug 18, 2022  0107 Pain significantly improved with administration of IV Dilaudid.  Patient currently in ultrasound.  H/H within normal limits. [JS]   0234 Updated patient on ultrasound results.  Cramping returning; will redose Dilaudid and add Toradol.  Patient will follow-up closely with GYN in 3 to 5 days for repeat beta-hCG and ultrasound.  Will discharge home with as needed pain medicines.  Strict return precautions given.  Patient verbalizes understanding agrees with plan of care. [JS]    Clinical Course User Index [JS] Paulette Blanch, MD     FINAL CLINICAL IMPRESSION(S) / ED DIAGNOSES   Final diagnoses:  Miscarriage     Rx / DC Orders   ED Discharge Orders          Ordered    oxyCODONE-acetaminophen (PERCOCET/ROXICET) 5-325 MG tablet  Every 4 hours PRN        08/18/22 0236    ondansetron (ZOFRAN-ODT) 4 MG disintegrating tablet  Every 8 hours PRN        08/18/22 0236             Note:  This document was prepared using Dragon voice recognition software and may include unintentional dictation errors.   Paulette Blanch, MD 08/18/22 [id number]

## 2022-10-12 IMAGING — US US PELVIS COMPLETE TRANSABD/TRANSVAG W DUPLEX AND/OR DOPPLER
1 series · 13 of 25 positions shown · non-contrast
Comparison: None.

CLINICAL DATA: Right lower quadrant abdominal pain for 1 day

EXAM:
TRANSABDOMINAL AND TRANSVAGINAL ULTRASOUND OF PELVIS
DOPPLER ULTRASOUND OF OVARIES
TECHNIQUE: Both transabdominal and transvaginal ultrasound examinations of the
pelvis were performed. Transabdominal technique was performed for
global imaging of the pelvis including uterus, ovaries, adnexal
regions, and pelvic cul-de-sac.
It was necessary to proceed with endovaginal exam following the
transabdominal exam to visualize the uterus, endometrium, and
ovaries. Color and duplex Doppler ultrasound was utilized to
evaluate blood flow to the ovaries.

[Series 1: us pelvic complete w transvaginal and torsion righ · 13 of 90 slices shown]
[im 1/90]
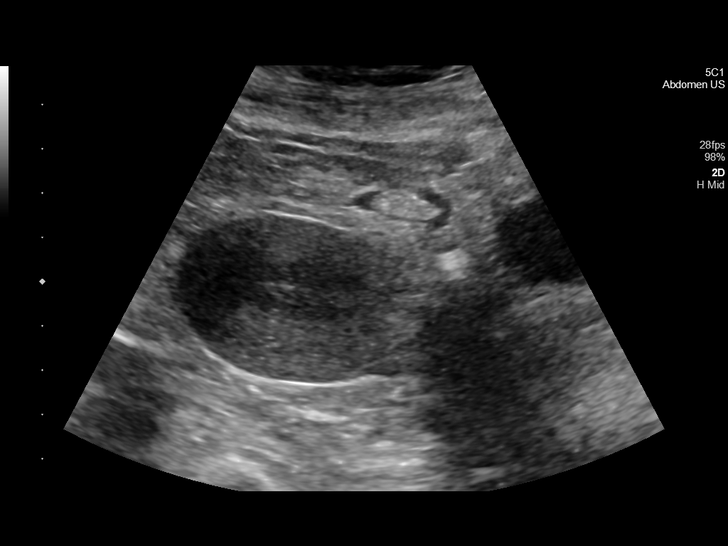
[im 8/90]
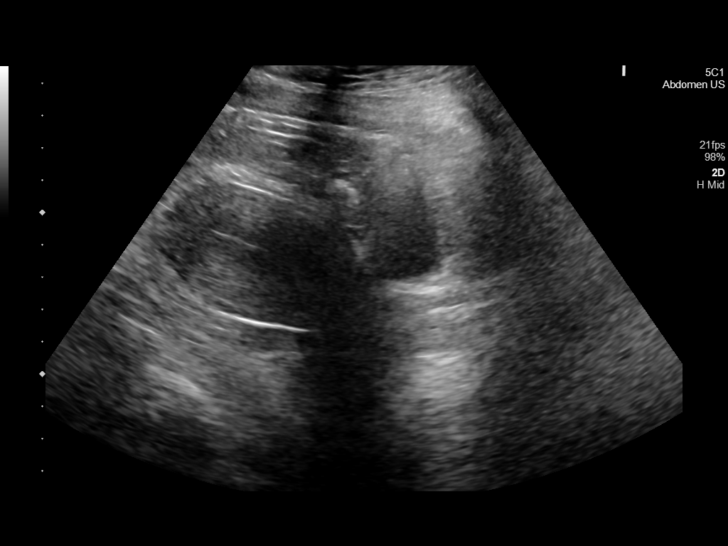
[im 15/90]
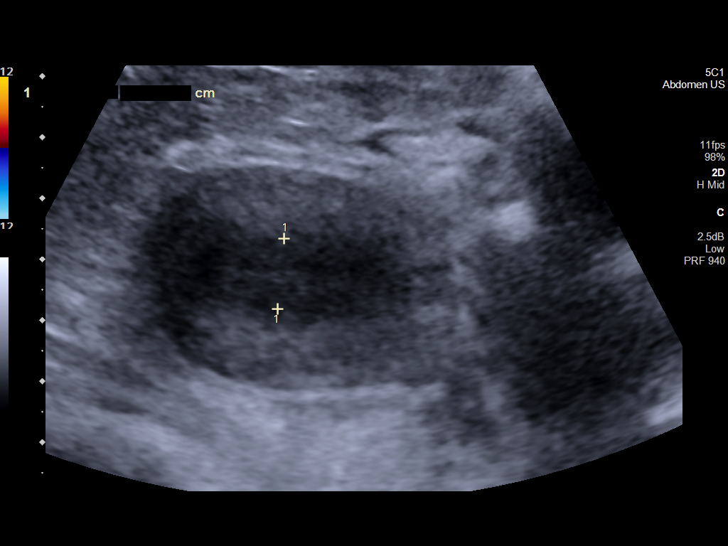
[im 23/90]
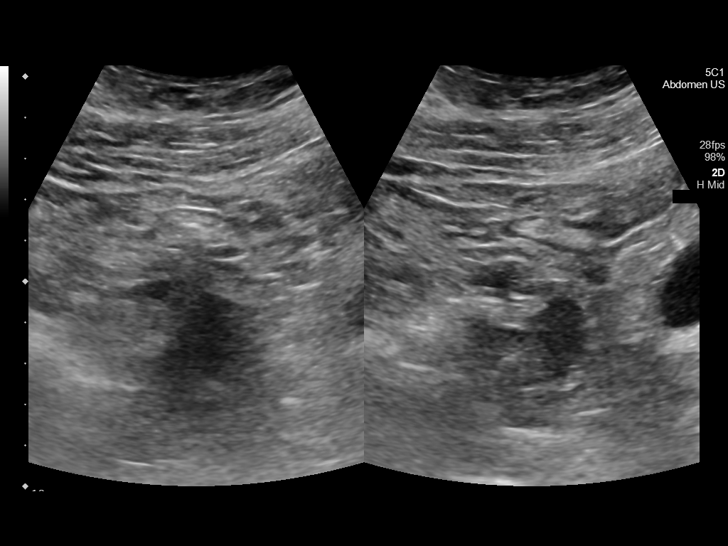
[im 30/90]
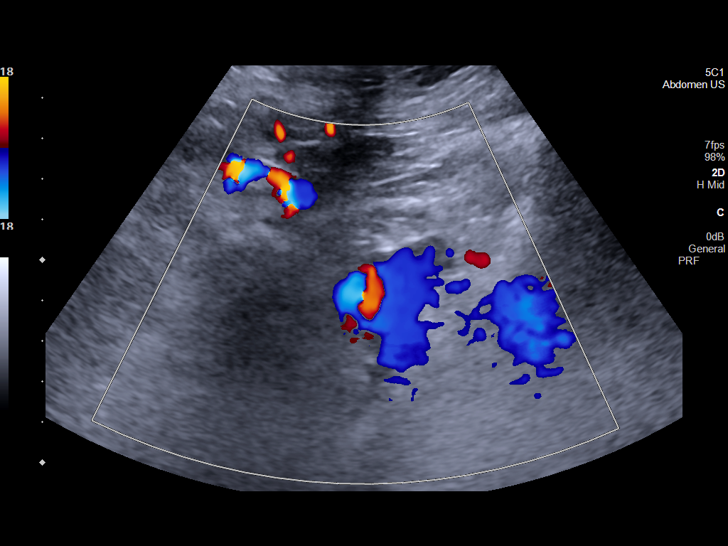
[im 38/90]
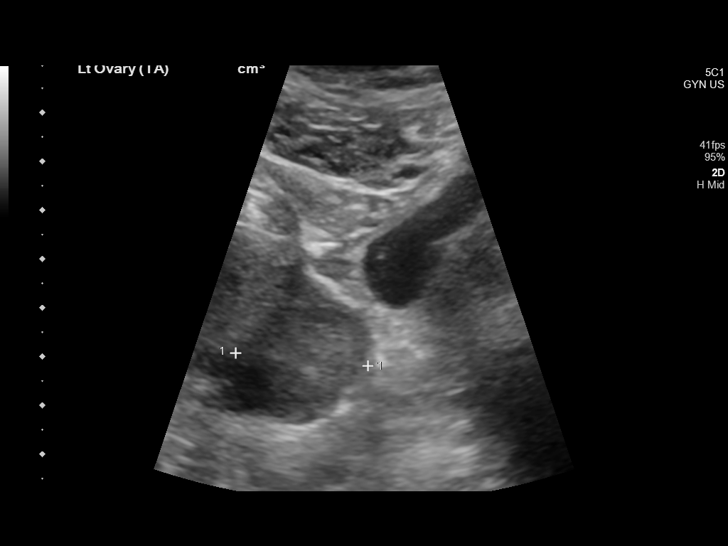
[im 45/90]
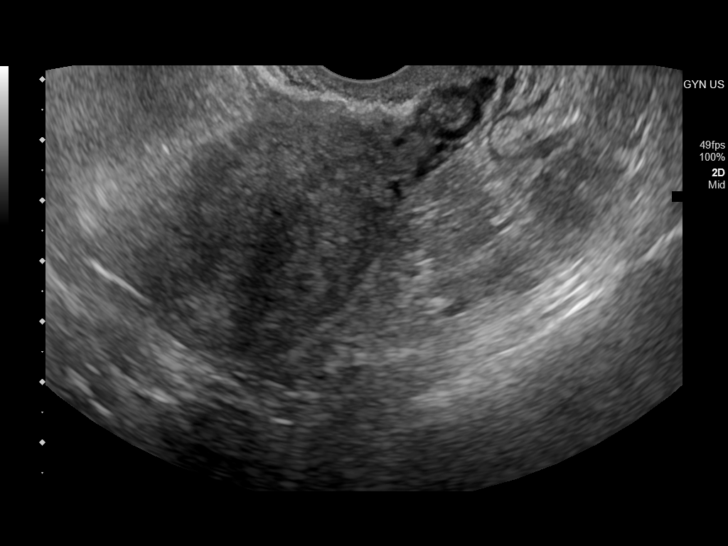
[im 52/90]
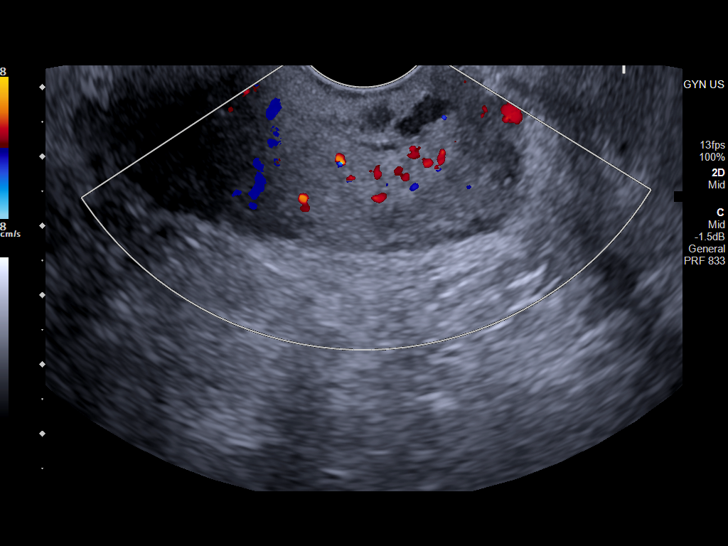
[im 60/90]
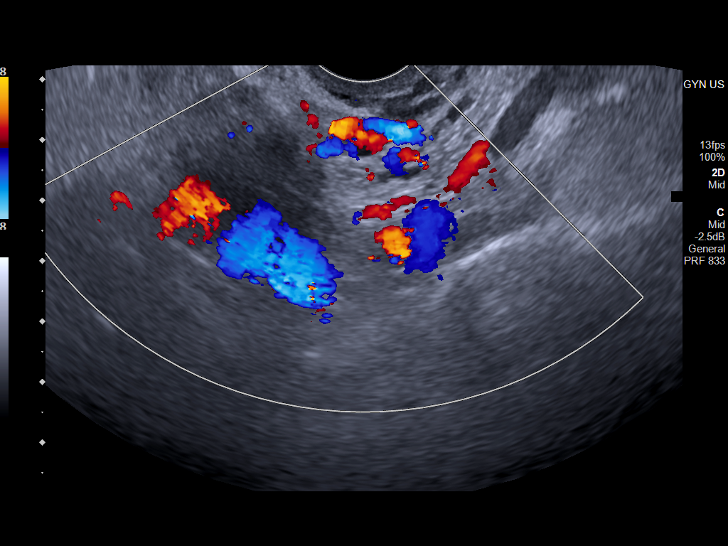
[im 67/90]
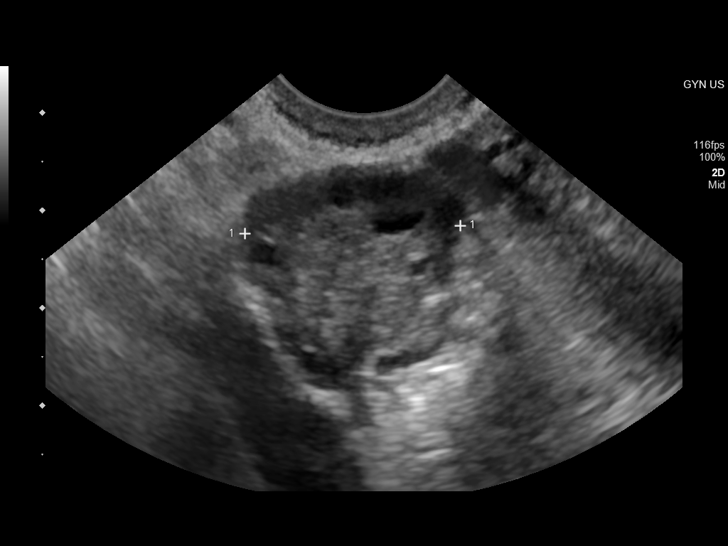
[im 75/90]
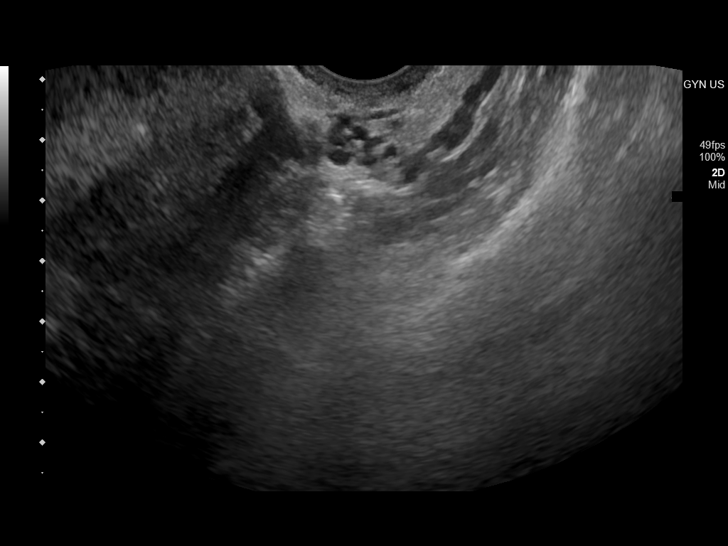
[im 82/90]
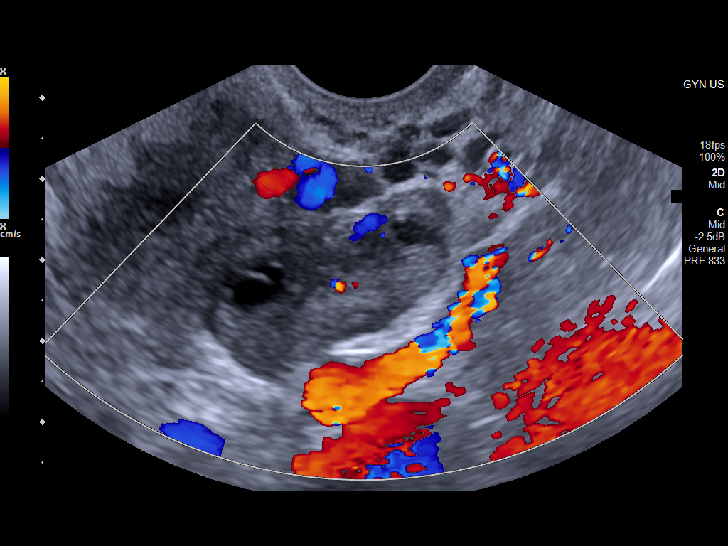
[im 90/90]
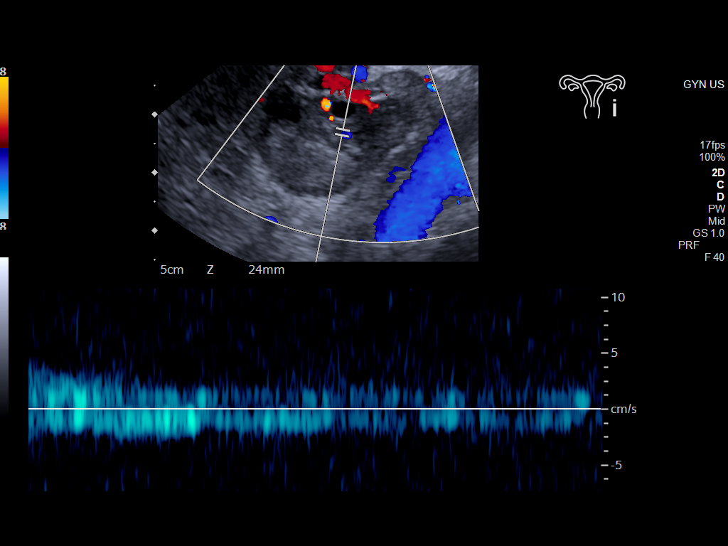

[13 of 25 positions shown; findings below may reference images not displayed]

FINDINGS: Uterus

Measurements: 8.8 x 3.9 x 4.4 cm = volume: 78 mL. No fibroids or
other mass visualized.

Endometrium

Thickness: 8 mm.  No focal abnormality visualized.

Right ovary

Measurements: 3.5 x 2.3 x 2.2 cm = volume: 9 mL. Normal
appearance/no adnexal mass. Small follicles.

Left ovary

Measurements: 2.8 x 1.7 x 3.2 cm = volume: 8 mL. Normal
appearance/no adnexal mass. Small follicles.

Pulsed Doppler evaluation of both ovaries demonstrates normal
low-resistance arterial and venous waveforms.

Other findings

No abnormal free fluid.
IMPRESSION: 1. No ultrasound findings of the pelvis to explain right lower
quadrant pain. Consider CT or MRI to further evaluate otherwise
unexplained abdominal pain.

2. Normal size and appearance of the ovaries. Symmetric arterial and
venous Doppler flow is present bilaterally.

## 2023-06-04 ENCOUNTER — Emergency Department
Admission: EM | Admit: 2023-06-04 | Discharge: 2023-06-04 | Disposition: A | Discharge status: Home / Self Care | Payer: Medicaid Other | Attending: Emergency Medicine | Admitting: Emergency Medicine

## 2023-06-04 ENCOUNTER — Other Ambulatory Visit: Payer: Self-pay

## 2023-06-04 DIAGNOSIS — R059 Cough, unspecified: Secondary | ICD-10-CM | POA: Insufficient documentation

## 2023-06-04 DIAGNOSIS — B974 Respiratory syncytial virus as the cause of diseases classified elsewhere: Secondary | ICD-10-CM | POA: Diagnosis not present

## 2023-06-04 DIAGNOSIS — R0602 Shortness of breath: Secondary | ICD-10-CM | POA: Insufficient documentation

## 2023-06-04 DIAGNOSIS — Z20822 Contact with and (suspected) exposure to covid-19: Secondary | ICD-10-CM | POA: Diagnosis not present

## 2023-06-04 DIAGNOSIS — B338 Other specified viral diseases: Secondary | ICD-10-CM

## 2023-06-04 DIAGNOSIS — R509 Fever, unspecified: Secondary | ICD-10-CM | POA: Diagnosis not present

## 2023-06-04 LAB — BASIC METABOLIC PANEL
Anion gap: 13 (ref 5–15)
BUN: 8 mg/dL (ref 6–20)
CO2: 20 mmol/L — ABNORMAL LOW (ref 22–32)
Calcium: 8.5 mg/dL — ABNORMAL LOW (ref 8.9–10.3)
Chloride: 102 mmol/L (ref 98–111)
Creatinine, Ser: 0.63 mg/dL (ref 0.44–1.00)
GFR, Estimated: >60 mL/min (ref >60)
Glucose, Bld: 162 mg/dL — ABNORMAL HIGH (ref 70–99)
Potassium: 3.6 mmol/L (ref 3.5–5.1)
Sodium: 135 mmol/L (ref 135–145)

## 2023-06-04 LAB — RESP PANEL BY RT-PCR (RSV, FLU A&B, COVID)  RVPGX2
Influenza A by PCR: NEGATIVE
Influenza B by PCR: NEGATIVE
Resp Syncytial Virus by PCR: POSITIVE — AB
SARS Coronavirus 2 by RT PCR: NEGATIVE

## 2023-06-04 LAB — CBC
HCT: 42.4 % (ref 36.0–46.0)
Hemoglobin: 13.9 g/dL (ref 12.0–15.0)
MCH: 26.7 pg (ref 26.0–34.0)
MCHC: 32.8 g/dL (ref 30.0–36.0)
MCV: 81.5 fL (ref 80.0–100.0)
Platelets: 230 10*3/uL (ref 150–400)
RBC: 5.2 MIL/uL — ABNORMAL HIGH (ref 3.87–5.11)
RDW: 14.3 % (ref 11.5–15.5)
WBC: 6.7 10*3/uL (ref 4.0–10.5)
nRBC: 0 % (ref 0.0–0.2)

## 2023-06-04 LAB — POC URINE PREG, ED: Preg Test, Ur: POSITIVE — AB

## 2023-06-04 LAB — GROUP A STREP BY PCR: Group A Strep by PCR: NOT DETECTED

## 2023-06-04 MED ORDER — ALBUTEROL SULFATE HFA 108 (90 BASE) MCG/ACT IN AERS: 2.0000 | INHALATION_SPRAY | Freq: Four times a day (QID) | RESPIRATORY_TRACT | 0 refills | Status: AC | PRN

## 2023-06-04 NOTE — ED Provider Notes (Signed)
 [facility] Provider Note    None    (approximate)   History   Hypertension   HPI  Olivia Alexander is a 38 y.o. female who presents today with history of 3 days of wet cough, fever that was not measured with thermometer, odynophagia, shortness of breath.  Patient reports no taste or no smell.  Patient reports last normal menstrual period for her was 2 months ago.      Physical Exam   Triage Vital Signs: ED Triage Vitals  Encounter Vitals Group     BP 06/04/23 1208 (!) 159/88     Systolic BP Percentile --      Diastolic BP Percentile --      Pulse Rate 06/04/23 1208 (!) 108     Resp 06/04/23 1208 18     Temp 06/04/23 1208 98 F (36.7 C)     Temp src --      SpO2 06/04/23 1208 100 %     Weight --      Height --      Head Circumference --      Peak Flow --      Pain Score 06/04/23 1207 7     Pain Loc --      Pain Education --      Exclude from Growth Chart --     Most recent vital signs: Vitals:   06/04/23 1208 06/04/23 1444  BP: (!) 159/88 (!) 166/89  Pulse: (!) 108 95  Resp: 18   Temp: 98 F (36.7 C)   SpO2: 100%      Constitutional: Alert , mild distress Eyes: Conjunctivae are normal.  Head: Atraumatic. Nose: No congestion/rhinnorhea. Mouth/Throat: Mucous membranes are moist.  No tonsillar exudate Neck: Painless ROM.  Cardiovascular:   Good peripheral circulation. Respiratory: Normal respiratory effort.  No retractions.  No wheezing, mobilization of secretions in both pulmonary bases Gastrointestinal: Soft and nontender.  Musculoskeletal:  no deformity Neurologic:  MAE spontaneously. No gross focal neurologic deficits are appreciated.  Skin:  Skin is warm, dry and intact. No rash noted. Psychiatric: Mood and affect are normal. Speech and behavior are normal.    ED Results / Procedures / Treatments   Labs (all labs ordered are listed, but only abnormal results are displayed) Labs Reviewed  RESP PANEL BY RT-PCR (RSV,  FLU A&B, COVID)  RVPGX2 - Abnormal; Notable for the following components:      Result Value   Resp Syncytial Virus by PCR POSITIVE (*)    All other components within normal limits  CBC - Abnormal; Notable for the following components:   RBC 5.20 (*)    All other components within normal limits  BASIC METABOLIC PANEL - Abnormal; Notable for the following components:   CO2 20 (*)    Glucose, Bld 162 (*)    Calcium  8.5 (*)    All other components within normal limits  POC URINE PREG, ED - Abnormal; Notable for the following components:   Preg Test, Ur Positive (*)    All other components within normal limits  GROUP A STREP BY PCR     EKG     RADIOLOGY   PROCEDURES:  Critical Care performed:   Procedures   MEDICATIONS ORDERED IN ED: Medications - No data to display   IMPRESSION / MDM / ASSESSMENT AND PLAN / ED COURSE  I reviewed the triage vital signs and the nursing notes.  Differential diagnosis includes, but is not limited to, RSV, flu,  COVID, pregnancy  Patient's presentation is most consistent with acute complicated illness / injury requiring diagnostic workup.   Patient's diagnosis is consistent with RSV bronchiolitis, pregnancy. I independently reviewed and interpreted imaging and agree with radiologists findings. Labs are rea reassuring. I did review the patient's allergies and medications. Patient will be discharged home with prescriptions for albuterol . Patient is to follow up with OB/GYN as needed or otherwise directed. Patient is given ED precautions to return to the ED for any worsening or new symptoms. Discussed plan of care with patient, answered all of patient's questions, Patient agreeable to plan of care. Advised patient to take medications according to the instructions on the label. Discussed possible side effects of new medications. Patient verbalized understanding. Clinical Course as of 06/04/23 1608  Sun Jun 04, 2023  1452 Group A Strep by PCR ([facility]  Only) Negative [AE]  1453 Resp panel by RT-PCR (RSV, Flu A&B, Covid) Anterior Nasal Swab(!) RSV positive [AE]  1516 POC urine preg, ED [AE]  1602 POC urine preg, ED(!) Positive [AE]    Clinical Course User Index [AE] Janit Kast, PA-C     FINAL CLINICAL IMPRESSION(S) / ED DIAGNOSES   Final diagnoses:  RSV (respiratory syncytial virus infection)     Rx / DC Orders   ED Discharge Orders          Ordered    albuterol  (VENTOLIN  HFA) 108 (90 Base) MCG/ACT inhaler  Every 6 hours PRN        06/04/23 1606             Note:  This document was prepared using Dragon voice recognition software and may include unintentional dictation errors.   Janit Kast, PA-C 06/04/23 1608    Viviann Pastor, MD 06/04/23 2312

## 2023-06-04 NOTE — Discharge Instructions (Signed)
 Have been diagnosed with RSV respiratory infection.  Please drink plenty of fluids, take acetaminophen  every 6 hours for fever or body aches.  Use albuterol  as directed.  Make an appointment for OB/GYN follow-up.  Please check your blood pressure at home and write the data in her journal.  Come back to ED or make an appointment with your PCP if new symptoms or symptoms worsen.

## 2023-06-04 NOTE — ED Triage Notes (Addendum)
 Pt comes with c/o HTN. Pt states she has HTN but not on any meds. Pt states no taste, smell cough for 3 days.   Pt also states sore throat.

## 2023-06-21 ENCOUNTER — Telehealth: Payer: Self-pay

## 2023-06-21 NOTE — Telephone Encounter (Signed)
Olivia Alexander called triage, she having lower back pain right side, she's currently pregnant doesn't know how far yet, no bleeding, no headaches, no dizziness. New phone [phone].   I already advised her to try Tylenol extra strength, heating pad, and soaking in a warm bath tub. Please advise

## 2023-06-23 ENCOUNTER — Encounter: Payer: Self-pay | Admitting: Emergency Medicine

## 2023-06-23 ENCOUNTER — Emergency Department
Admission: EM | Admit: 2023-06-23 | Discharge: 2023-06-23 | Disposition: A | Discharge status: Home / Self Care | Payer: 59 | Attending: Emergency Medicine | Admitting: Emergency Medicine

## 2023-06-23 ENCOUNTER — Emergency Department: Payer: 59

## 2023-06-23 ENCOUNTER — Other Ambulatory Visit: Payer: Self-pay

## 2023-06-23 DIAGNOSIS — O26891 Other specified pregnancy related conditions, first trimester: Secondary | ICD-10-CM

## 2023-06-23 DIAGNOSIS — O09511 Supervision of elderly primigravida, first trimester: Secondary | ICD-10-CM | POA: Diagnosis not present

## 2023-06-23 DIAGNOSIS — O219 Vomiting of pregnancy, unspecified: Secondary | ICD-10-CM | POA: Diagnosis not present

## 2023-06-23 DIAGNOSIS — R197 Diarrhea, unspecified: Secondary | ICD-10-CM | POA: Diagnosis not present

## 2023-06-23 DIAGNOSIS — I10 Essential (primary) hypertension: Secondary | ICD-10-CM | POA: Diagnosis not present

## 2023-06-23 DIAGNOSIS — Z20822 Contact with and (suspected) exposure to covid-19: Secondary | ICD-10-CM | POA: Diagnosis not present

## 2023-06-23 DIAGNOSIS — O2341 Unspecified infection of urinary tract in pregnancy, first trimester: Secondary | ICD-10-CM | POA: Insufficient documentation

## 2023-06-23 DIAGNOSIS — N390 Urinary tract infection, site not specified: Secondary | ICD-10-CM

## 2023-06-23 DIAGNOSIS — R112 Nausea with vomiting, unspecified: Secondary | ICD-10-CM

## 2023-06-23 DIAGNOSIS — Z3A01 Less than 8 weeks gestation of pregnancy: Secondary | ICD-10-CM | POA: Insufficient documentation

## 2023-06-23 LAB — RESP PANEL BY RT-PCR (RSV, FLU A&B, COVID)  RVPGX2
Influenza A by PCR: NEGATIVE
Influenza B by PCR: NEGATIVE
Resp Syncytial Virus by PCR: NEGATIVE
SARS Coronavirus 2 by RT PCR: NEGATIVE

## 2023-06-23 LAB — COMPREHENSIVE METABOLIC PANEL
ALT: 14 U/L (ref 0–44)
AST: 15 U/L (ref 15–41)
Albumin: 3.7 g/dL (ref 3.5–5.0)
Alkaline Phosphatase: 71 U/L (ref 38–126)
Anion gap: 15 (ref 5–15)
BUN: 12 mg/dL (ref 6–20)
CO2: 21 mmol/L — ABNORMAL LOW (ref 22–32)
Calcium: 9.1 mg/dL (ref 8.9–10.3)
Chloride: 100 mmol/L (ref 98–111)
Creatinine, Ser: 0.62 mg/dL (ref 0.44–1.00)
GFR, Estimated: >60 mL/min (ref >60)
Glucose, Bld: 114 mg/dL — ABNORMAL HIGH (ref 70–99)
Potassium: 3.8 mmol/L (ref 3.5–5.1)
Sodium: 136 mmol/L (ref 135–145)
Total Bilirubin: 0.6 mg/dL (ref 0.0–1.2)
Total Protein: 7.8 g/dL (ref 6.5–8.1)

## 2023-06-23 LAB — URINALYSIS, ROUTINE W REFLEX MICROSCOPIC
Bilirubin Urine: NEGATIVE
Glucose, UA: NEGATIVE mg/dL
Hgb urine dipstick: NEGATIVE
Ketones, ur: 20 mg/dL — AB
Nitrite: NEGATIVE
Protein, ur: 30 mg/dL — AB
Specific Gravity, Urine: 1.029 (ref 1.005–1.030)
pH: 5 (ref 5.0–8.0)

## 2023-06-23 LAB — CBC
HCT: 44.2 % (ref 36.0–46.0)
Hemoglobin: 14.7 g/dL (ref 12.0–15.0)
MCH: 26.9 pg (ref 26.0–34.0)
MCHC: 33.3 g/dL (ref 30.0–36.0)
MCV: 80.8 fL (ref 80.0–100.0)
Platelets: 323 10*3/uL (ref 150–400)
RBC: 5.47 MIL/uL — ABNORMAL HIGH (ref 3.87–5.11)
RDW: 13.9 % (ref 11.5–15.5)
WBC: 12.6 10*3/uL — ABNORMAL HIGH (ref 4.0–10.5)
nRBC: 0 % (ref 0.0–0.2)

## 2023-06-23 LAB — LIPASE, BLOOD: Lipase: 28 U/L (ref 11–51)

## 2023-06-23 LAB — HCG, QUANTITATIVE, PREGNANCY: hCG, Beta Chain, Quant, S: 67747 m[IU]/mL — ABNORMAL HIGH (ref <5)

## 2023-06-23 MED ORDER — CEPHALEXIN 500 MG PO CAPS: 500.0000 mg | ORAL_CAPSULE | Freq: Two times a day (BID) | ORAL | 0 refills | Status: DC

## 2023-06-23 MED ORDER — ACETAMINOPHEN 500 MG PO TABS
1000.0000 mg | ORAL_TABLET | Freq: Once | ORAL | Status: AC
  Administered 2023-06-23: 1000 mg via ORAL
  Filled 2023-06-23: qty 2

## 2023-06-23 MED ORDER — ONDANSETRON HCL 4 MG/2ML IJ SOLN
4.0000 mg | Freq: Once | INTRAMUSCULAR | Status: AC
  Administered 2023-06-23: 4 mg via INTRAVENOUS
  Filled 2023-06-23: qty 2

## 2023-06-23 MED ORDER — CEPHALEXIN 500 MG PO CAPS
500.0000 mg | ORAL_CAPSULE | Freq: Once | ORAL | Status: AC
  Administered 2023-06-23: 500 mg via ORAL
  Filled 2023-06-23: qty 1

## 2023-06-23 MED ORDER — ONDANSETRON 4 MG PO TBDP: 4.0000 mg | ORAL_TABLET | Freq: Four times a day (QID) | ORAL | 0 refills | Status: DC | PRN

## 2023-06-23 MED ORDER — SODIUM CHLORIDE 0.9 % IV BOLUS (SEPSIS)
1000.0000 mL | Freq: Once | INTRAVENOUS | Status: AC
  Administered 2023-06-23: 1000 mL via INTRAVENOUS

## 2023-06-23 NOTE — Discharge Instructions (Addendum)
You may take Tylenol 1000 mg every 6 hours as needed for pain.  You may take over-the-counter Imodium as needed for diarrhea.  I recommend a bland diet and increase fluid intake.

## 2023-06-23 NOTE — ED Triage Notes (Signed)
Patient ambulatory to triage with complaints of low back pain, abdominal cramping, chills, diarrhea and vomiting. She states the back pain started Wednesday and she called OB without response. Patient states she is currently pregnant, unknown how far along, LMP 05/03/23.

## 2023-06-23 NOTE — ED Provider Notes (Signed)
[facility] Provider Note    Event Date/Time   First MD Initiated Contact with Patient 06/23/23 [phone]     (approximate)   History   Abdominal Cramping   HPI  Olivia Alexander is a 38 y.o. female with previous history of C-section who presents to the emergency department with nausea, vomiting, diarrhea and abdominal pain.  States she has taken Tylenol without relief.  No dysuria, hematuria, vaginal bleeding or discharge.  Patient is currently pregnant but is not sure how far along she is.  She thinks her LMP was 05/03/2023.  She is seen by [facility].   History provided by patient, family.    History reviewed. No pertinent past medical history.  Past Surgical History:  Procedure Laterality Date   CESAREAN SECTION      MEDICATIONS:  Prior to Admission medications   Medication Sig Start Date End Date Taking? Authorizing Provider  albuterol (VENTOLIN HFA) 108 (90 Base) MCG/ACT inhaler Inhale 2 puffs into the lungs every 6 (six) hours as needed for wheezing or shortness of breath. 06/04/23   Gladys Damme, PA-C  ondansetron (ZOFRAN-ODT) 4 MG disintegrating tablet Take 1 tablet (4 mg total) by mouth every 8 (eight) hours as needed for nausea or vomiting. 08/18/22   Irean Hong, MD  oxyCODONE-acetaminophen (PERCOCET/ROXICET) 5-325 MG tablet Take 1 tablet by mouth every 4 (four) hours as needed for severe pain. 08/18/22   Irean Hong, MD  FLUoxetine (PROZAC) 20 MG tablet Take 20 mg by mouth daily.  06/28/19  [provider]    Physical Exam   Triage Vital Signs: ED Triage Vitals  Encounter Vitals Group     BP 06/23/23 0129 (!) 183/119     Systolic BP Percentile --      Diastolic BP Percentile --      Pulse Rate 06/23/23 0129 (!) 106     Resp 06/23/23 0129 18     Temp 06/23/23 0129 98.7 F (37.1 C)     Temp Source 06/23/23 0129 Oral     SpO2 06/23/23 0129 96 %     Weight 06/23/23 0130 230 lb (104.3 kg)     Height 06/23/23 0130 5\' 1"   (1.549 m)     Head Circumference --      Peak Flow --      Pain Score 06/23/23 0130 7     Pain Loc --      Pain Education --      Exclude from Growth Chart --     Most recent vital signs: Vitals:   06/23/23 0416 06/23/23 0554  BP: (!) 170/97   Pulse: (!) 102   Resp: 19   Temp:  98.4 F (36.9 C)  SpO2: 100%     CONSTITUTIONAL: Alert, responds appropriately to questions. Well-appearing; well-nourished HEAD: Normocephalic, atraumatic EYES: Conjunctivae clear, pupils appear equal, sclera nonicteric ENT: normal nose; moist mucous membranes NECK: Supple, normal ROM CARD: Regular tachycardic; S1 and S2 appreciated RESP: Normal chest excursion without splinting or tachypnea; breath sounds clear and equal bilaterally; no wheezes, no rhonchi, no rales, no hypoxia or respiratory distress, speaking full sentences ABD/GI: Non-distended; soft, non-tender, no rebound, no guarding, no peritoneal signs, no tenderness at McBurney's point BACK: The back appears normal EXT: Normal ROM in all joints; no deformity noted, no edema SKIN: Normal color for age and race; warm; no rash on exposed skin NEURO: Moves all extremities equally, normal speech, ambulates with normal gait PSYCH: The patient's mood and  manner are appropriate.   ED Results / Procedures / Treatments   LABS: (all labs ordered are listed, but only abnormal results are displayed) Labs Reviewed  COMPREHENSIVE METABOLIC PANEL - Abnormal; Notable for the following components:      Result Value   CO2 21 (*)    Glucose, Bld 114 (*)    All other components within normal limits  CBC - Abnormal; Notable for the following components:   WBC 12.6 (*)    RBC 5.47 (*)    All other components within normal limits  URINALYSIS, ROUTINE W REFLEX MICROSCOPIC - Abnormal; Notable for the following components:   Color, Urine YELLOW (*)    APPearance CLOUDY (*)    Ketones, ur 20 (*)    Protein, ur 30 (*)    Leukocytes,Ua SMALL (*)     Bacteria, UA RARE (*)    All other components within normal limits  HCG, QUANTITATIVE, PREGNANCY - Abnormal; Notable for the following components:   hCG, Beta Chain, Quant, S 54,098 (*)    All other components within normal limits  RESP PANEL BY RT-PCR (RSV, FLU A&B, COVID)  RVPGX2  URINE CULTURE  LIPASE, BLOOD     EKG:  RADIOLOGY: My personal review and interpretation of imaging: OB ultrasound shows intrauterine pregnancy with normal fetal heart rate.  I have personally reviewed all radiology reports.   US OB LESS THAN 14 WEEKS WITH OB TRANSVAGINAL Result Date: 06/23/2023 CLINICAL DATA:  38 year old female with pain and cramping for 2 days in the 1st trimester of pregnancy. Quantitative beta HCG [id number]. Estimated gestational age by LMP is 7 weeks and 2 days. EXAM: OBSTETRIC <14 WK [place] AND TRANSVAGINAL OB US TECHNIQUE: Both transabdominal and transvaginal ultrasound examinations were performed for complete evaluation of the gestation as well as the maternal uterus, adnexal regions, and pelvic cul-de-sac. Transvaginal technique was performed to assess early pregnancy. COMPARISON:  None relevant. FINDINGS: Intrauterine gestational sac: Single Yolk sac:  Visible Embryo:  Visible Cardiac Activity: Detected Heart Rate: 158 bpm CRL:  11 mm   7 w   2 d                  [place] EDC: 02/07/2024 Subchorionic hemorrhage:  None visualized. Maternal uterus/adnexae: No pelvis free fluid. Right ovary could not be identified. Left ovary (series 1, image 85) measures 4.9 x 2.8 by 2.2 cm (16 mL) and appears to contain the corpus luteum (image 89). IMPRESSION: Single living IUP with estimated gestational age of [redacted] weeks and 2 days by CRL. No acute maternal findings visualized. Electronically Signed   By: Odessa Fleming M.D.   On: 06/23/2023 04:00     PROCEDURES:  Critical Care performed: No      Procedures    IMPRESSION / MDM / ASSESSMENT AND PLAN / ED COURSE  I reviewed the triage vital signs and the nursing  notes.    Patient here with complaints of nausea, vomiting, diarrhea, abdominal pain, back pain.  Currently pregnant.     DIFFERENTIAL DIAGNOSIS (includes but not limited to):   Viral gastroenteritis, ectopic pregnancy, miscarriage, UTI, low suspicion for PID, torsion, appendicitis, colitis, diverticulitis, bowel obstruction given benign abdominal exam   Patient's presentation is most consistent with acute complicated illness / injury requiring diagnostic workup.   PLAN: Labs show slight leukocytosis.  Normal electrolytes, LFTs, lipase.  hCG 67,000.  COVID, flu and RSV negative.  OB ultrasound pending.  Urine pending.  Will give IV fluids, Tylenol, Zofran.  MEDICATIONS GIVEN IN ED: Medications  acetaminophen (TYLENOL) tablet 1,000 mg (1,000 mg Oral Given 06/23/23 0408)  sodium chloride 0.9 % bolus 1,000 mL (0 mLs Intravenous Stopped 06/23/23 0442)  ondansetron (ZOFRAN) injection 4 mg (4 mg Intravenous Given 06/23/23 0409)  cephALEXin (KEFLEX) capsule 500 mg (500 mg Oral Given 06/23/23 0554)     ED COURSE: Ultrasound reviewed and interpreted by myself and the radiologist and shows single IUP measuring 7 weeks, 2 days.  Normal fetal heart rate.  Patient feeling better and tolerating p.o.  Urine does show white blood cells and bacteria but also squamous cells.  This could be a dirty catch but given her back pain and that she is pregnant, we will start her on Keflex for potential UTI.  Culture is pending.  Patient has been hypertensive here.  Suspect this is chronic hypertension given patient is in her first trimester.  She has outpatient follow-up with her OB/GYN.  Will defer to her OB/GYN to start antihypertensives if blood pressures continue to be elevated when patient is feeling better.  At this time, I do not feel there is any life-threatening condition present. I reviewed all nursing notes, vitals, pertinent previous records.  All lab and urine results, EKGs, imaging ordered have been  independently reviewed and interpreted by myself.  I reviewed all available radiology reports from any imaging ordered this visit.  Based on my assessment, I feel the patient is safe to be discharged home without further emergent workup and can continue workup as an outpatient as needed. Discussed all findings, treatment plan as well as usual and customary return precautions.  They verbalize understanding and are comfortable with this plan.  Outpatient follow-up has been provided as needed.  All questions have been answered.  CONSULTS:  none   OUTSIDE RECORDS REVIEWED: Reviewed previous ED notes were patient has been noted to be extremely hypertensive since 2022.       FINAL CLINICAL IMPRESSION(S) / ED DIAGNOSES   Final diagnoses:  Nausea vomiting and diarrhea  Abdominal pain during pregnancy in first trimester  Acute UTI  Hypertension, unspecified type     Rx / DC Orders   ED Discharge Orders          Ordered    ondansetron (ZOFRAN-ODT) 4 MG disintegrating tablet  Every 6 hours PRN        06/23/23 0513    cephALEXin (KEFLEX) 500 MG capsule  2 times daily        06/23/23 9562             Note:  This document was prepared using Dragon voice recognition software and may include unintentional dictation errors.   Vaneza Pickart, Layla Maw, DO 06/23/23 [phone]

## 2023-06-25 LAB — URINE CULTURE: Culture: 20000 — AB

## 2023-06-26 ENCOUNTER — Encounter: Payer: Self-pay | Admitting: Obstetrics

## 2023-06-26 ENCOUNTER — Ambulatory Visit (INDEPENDENT_AMBULATORY_CARE_PROVIDER_SITE_OTHER): Payer: 59 | Admitting: Obstetrics

## 2023-06-26 VITALS — BP 145/88 | HR 74 | Wt 238.0 lb

## 2023-06-26 DIAGNOSIS — Z3200 Encounter for pregnancy test, result unknown: Secondary | ICD-10-CM

## 2023-06-26 DIAGNOSIS — Z98891 History of uterine scar from previous surgery: Secondary | ICD-10-CM | POA: Insufficient documentation

## 2023-06-26 DIAGNOSIS — Z3201 Encounter for pregnancy test, result positive: Secondary | ICD-10-CM | POA: Diagnosis not present

## 2023-06-26 DIAGNOSIS — Z7689 Persons encountering health services in other specified circumstances: Secondary | ICD-10-CM

## 2023-06-26 DIAGNOSIS — N912 Amenorrhea, unspecified: Secondary | ICD-10-CM

## 2023-06-26 DIAGNOSIS — O10919 Unspecified pre-existing hypertension complicating pregnancy, unspecified trimester: Secondary | ICD-10-CM | POA: Insufficient documentation

## 2023-06-26 DIAGNOSIS — Z6841 Body Mass Index (BMI) 40.0 and over, adult: Secondary | ICD-10-CM | POA: Insufficient documentation

## 2023-06-26 LAB — POCT URINE PREGNANCY: Preg Test, Ur: POSITIVE — AB

## 2023-06-26 MED ORDER — ASPIRIN 81 MG PO TBEC: 81.0000 mg | DELAYED_RELEASE_TABLET | Freq: Every day | ORAL | 12 refills | Status: DC

## 2023-06-26 MED ORDER — DOXYLAMINE-PYRIDOXINE 10-10 MG PO TBEC: 4.0000 | DELAYED_RELEASE_TABLET | Freq: Every day | ORAL | 0 refills | Status: AC

## 2023-06-26 MED ORDER — NIFEDIPINE ER OSMOTIC RELEASE 30 MG PO TB24: 30.0000 mg | ORAL_TABLET | Freq: Every day | ORAL | 11 refills | Status: DC

## 2023-06-26 MED ORDER — CVS PRENATAL GUMMY 0.18-25 MG PO CHEW: 1.0000 | CHEWABLE_TABLET | Freq: Every day | ORAL | 6 refills | Status: AC

## 2023-06-26 NOTE — Progress Notes (Signed)
Pregnancy Confirmation Visit  SUBJECTIVE  Olivia Alexander is a 38 y.o. [phone]  who presents for evaluation of amenorrhea and possible pregnancy. Patient's last menstrual period was 05/03/2023 (exact date).  She reports irregular periods. Pregnancy is desired. Current symptoms include nausea, fatigue. She has a history of HTN in her last pregnancy and at her health care visits since then. Her pressures today are mild range. She had severe range BPs at her ED visit on 06/23/23.  Review of Systems Pertinent items are noted in HPI.  OBJECTIVE BP (!) 145/88   Pulse 74   Wt 238 lb (108 kg)   LMP 05/03/2023 (Exact Date)   Breastfeeding No   BMI 44.97 kg/m  Body mass index is 44.97 kg/m.  alert, well appearing, and in no distress  Lab Review Urine hCG:  ASSESSMENT Amenorrhea.  Presumed pregnancy at [redacted]w[redacted]d with Estimated Date of Delivery: 02/07/24.  PLAN  Encouraged a well-balanced diet, rest, hydration, prenatal vitamins, and walking for exercise. Counseled to avoid alcohol, tobacco, and recreational drugs and to minimize caffeine intake. Safe medications list given. Discussed non-pharmacologic relief measures for nausea. Rx sent of Diclegis. Oriented to [facility]. Genetic screening options were discussed.  A dating [place] was ordered. Return in 4 weeks for labs, NOB physical, and genetic screening if desired.  -Started on Procardia XL 30 mg with plan for BP check in one week. Reviewed proper administration, side effects, and warning signs.  Guadlupe Spanish, CNM

## 2023-06-27 ENCOUNTER — Telehealth: Payer: Self-pay

## 2023-06-27 NOTE — Telephone Encounter (Signed)
Spoke with Verda Cumins. They advised the rx was transferred to [facility] yesterday and to disregard this request.

## 2023-06-28 NOTE — Telephone Encounter (Signed)
Patient states rx was sent to [facility] she needs [facility] to go to [facility] Olivia [facility]. Advised per [employer] rx was [facility] to [facility]. Patient will verify with pharmacy that all rx's have been transferred. [facility] is not currently taking her insurance. Pharmacy changed in EPIC.

## 2023-07-03 ENCOUNTER — Ambulatory Visit (INDEPENDENT_AMBULATORY_CARE_PROVIDER_SITE_OTHER): Payer: 59

## 2023-07-03 VITALS — BP 159/80 | HR 103 | Resp 16 | Ht 61.0 in | Wt 242.5 lb

## 2023-07-03 DIAGNOSIS — Z0130 Encounter for examination of blood pressure without abnormal findings: Secondary | ICD-10-CM

## 2023-07-03 DIAGNOSIS — O10919 Unspecified pre-existing hypertension complicating pregnancy, unspecified trimester: Secondary | ICD-10-CM

## 2023-07-03 MED ORDER — NIFEDIPINE ER OSMOTIC RELEASE 60 MG PO TB24: 60.0000 mg | ORAL_TABLET | Freq: Every day | ORAL | 1 refills | Status: DC

## 2023-07-03 NOTE — Progress Notes (Signed)
    NURSE VISIT NOTE  Subjective:    Patient ID: Olivia Alexander, female    DOB: 10-20-1985, 38 y.o.   MRN: [id number]  HPI  Patient is a 38 y.o. G27P1021 female who presents for BP check per order from Guadlupe Spanish, CNM. Patient has epigastric pain and headaches x 2 days.  Patient reports compliance with prescribed BP medications: yes Procardia 30 mg daily Last dose of BP medication:  this morning at 9:15 am  BP Readings from Last 3 Encounters:  06/26/23 (!) 145/88  06/23/23 (!) 170/97  06/04/23 (!) 166/89   Pulse Readings from Last 3 Encounters:  06/26/23 74  06/23/23 (!) 102  06/04/23 95    Objective:    LMP 05/03/2023 (Exact Date)   Assessment:   1. Chronic hypertension affecting pregnancy      Plan:   Per Dr. Guadlupe Spanish, CNM:  Treatment: Increase dose of Procardia 30 mg daily to Procardia 60 mg daily per Noelle Penner, CNM Continue current treatment regimen. Continue to monitor blood pressure at home. Report any reading >140/90 or with any associated symptoms. RTC in 1 week for BP check Return to clinic as scheduled.   Patient verbalized understanding of instructions.    Santiago Bumpers, CMA [facility]

## 2023-07-03 NOTE — Patient Instructions (Addendum)
High Blood Pressure During Pregnancy High blood pressure, or hypertension, is when the blood in your body moves with so much force that it could affect your health. It can cause problems for you and your baby. Three types of high blood pressure that can happen during pregnancy. They are: Chronic high blood pressure. This is when you've had high blood pressure for a while, even before getting pregnant. It doesn't go away after your baby is born. Gestational high blood pressure. This type starts after you're [redacted] weeks pregnant and usually goes away once your baby is born. Postpartum high blood pressure. This type is most common within 1 to 2 days after delivery, but it can also happen later -- even up to 12 weeks or more after pregnancy. It can be: High blood pressure that you had before your baby was born and continues after delivery. High blood pressure that starts after you've given birth. If your blood pressure gets really high, it's an emergency. You need to be treated right away. How does high blood pressure affect me? If you had blood pressure problems during pregnancy, you're more likely to get this condition after giving birth. High blood pressure can even happen 12 weeks or more after your baby is born. You may also: Get it when you are pregnant next time. Get it later in life. In some cases, this condition can cause serious problems, such as: Heart problems, such as stroke or heart attack. Injury to kidneys, lungs, or liver. Pre-eclampsia. HELLP syndrome. Seizures. Problems with the placenta. How does high blood pressure affect my baby? Your baby may: Be born early. Have a low birth weight. Have problems during labor. This may mean a C-section could be needed quickly. What are the risks for high blood pressure during pregnancy? You're more likely to get high blood pressure during pregnancy if: You had high blood pressure during a past pregnancy. You're overweight. You're 35 years  or older. You're pregnant for the first time. You're pregnant with more than one baby. You used a fertility method, such as IVF, to get pregnant. You have other problems, such as diabetes, kidney disease, or lupus. What can I do to lower my risk?  Keep a healthy weight. Eat a healthy diet. If you have long-term (chronic) conditions, have them treated before you become pregnant. How is this condition treated? Treatment depends on the type of high blood pressure you have and how serious it is. If you have high blood pressure, your health care provider may give you medicine to treat it and also to lessen risks to you and the baby. If you have very bad high blood pressure, you may need to stay in the hospital for treatment. If your condition gets worse, your baby may need to be born early. If you were taking medicine for your blood pressure before you got pregnant, talk with your provider. You may need to change the medicine during pregnancy if it is not safe for your baby. Follow these instructions at home: Eating and drinking Drink more fluids as told. Avoid caffeine. Lifestyle Do not use alcohol or drugs. Do not smoke, vape, or use nicotine or tobacco. Avoid stress as much as you can. Rest and get plenty of sleep. Get regular exercise. This can help to lower your blood pressure. Ask your health care provider what kinds of exercise are safe for you. General instructions Take your medicines only as told. Keep all follow-up visits. Your provider will check your blood pressure and  make sure that you and your baby are healthy. Contact a health care provider if: Your baby is not moving as much as usual. You feel very tired. You feel faint or dizzy. You throw up, or feel like you may throw up. You have cramping in your belly or have pain in your hips or lower back. You have spotting or bleeding, or you leak fluid from your vagina. Get help right away if: You have chest pain or trouble  breathing. You faint, have a seizure, or cannot think clearly. You have symptoms of serious problems, such as: A headache that doesn't go away when you take medicine. Very bad and sudden swelling of your face, hands, legs, or feet. Vision problems, such as: Seeing spots. Blurry vision. Being sensitive to light. These symptoms may be an emergency. Call 911 right away. Do not wait to see if the symptoms will go away. Do not drive yourself to the hospital. This information is not intended to replace advice given to you by your health care provider. Make sure you discuss any questions you have with your health care provider. Document Revised: 03/28/2023 Document Reviewed: 11/08/2022 Elsevier Patient Education  2024 Elsevier Inc.   Headache Prevention: -- Magnesium, [phone] mg at night; ; if you experience constipation with pregnancy take Magnesium Citrate (Natural Calm is a good choice); if you do not have concerns about constipation or you have diarrhea take Magnesium Glycinate -- Vitamin B-2, 200mg  twice a day  Acute treatment: -- Benadryl 25-50 mg -- Sudafed 30-60mg  -- Tylenol 650-1000mg  -- Extra Magnesium 750mg  -- Reglan 10mg  twice a day (needs prescription)

## 2023-07-07 ENCOUNTER — Telehealth (INDEPENDENT_AMBULATORY_CARE_PROVIDER_SITE_OTHER): Payer: 59

## 2023-07-07 DIAGNOSIS — O24919 Unspecified diabetes mellitus in pregnancy, unspecified trimester: Secondary | ICD-10-CM | POA: Insufficient documentation

## 2023-07-07 DIAGNOSIS — O0990 Supervision of high risk pregnancy, unspecified, unspecified trimester: Secondary | ICD-10-CM | POA: Insufficient documentation

## 2023-07-07 DIAGNOSIS — Z3480 Encounter for supervision of other normal pregnancy, unspecified trimester: Secondary | ICD-10-CM | POA: Insufficient documentation

## 2023-07-07 DIAGNOSIS — Z3689 Encounter for other specified antenatal screening: Secondary | ICD-10-CM

## 2023-07-07 DIAGNOSIS — N96 Recurrent pregnancy loss: Secondary | ICD-10-CM | POA: Insufficient documentation

## 2023-07-07 DIAGNOSIS — Z862 Personal history of diseases of the blood and blood-forming organs and certain disorders involving the immune mechanism: Secondary | ICD-10-CM

## 2023-07-07 DIAGNOSIS — Z8632 Personal history of gestational diabetes: Secondary | ICD-10-CM

## 2023-07-07 NOTE — Progress Notes (Addendum)
 New OB Intake  I connected with  Olivia Alexander on 07/07/23 at  2:15 PM EST by Video Visit and verified that I am speaking with the correct person using two identifiers. Nurse is located at [facility] and pt is located at home.  I explained I am completing New OB Intake today. We discussed her EDD of 02/07/24 that is based on ultrasound. Pt is G4/P1. I reviewed her allergies, medications, Medical/Surgical/OB history, and appropriate screenings. There are no cats in the home. Based on history, this is a/an pregnancy uncomplicated . Her obstetrical history is significant for  N/A .  Patient Active Problem List   Diagnosis Date Noted   Supervision of other normal pregnancy, antepartum 07/07/2023   Chronic hypertension affecting pregnancy 06/26/2023   History of C-section 06/26/2023   BMI 40.0-44.9, adult (HCC) 06/26/2023    Concerns addressed today: Patient would like information on resources for baby items. Advised I would find out and get back to her.  Delivery Plans:  Plans to deliver at [facility].  Anatomy US  Explained Anatomy US  will be done at 20 weeks of gestational age.  Labs Discussed genetic screening with patient. Patient desires genetic testing to be drawn at new OB visit. Discussed possible labs to be drawn at new OB appointment.  COVID Vaccine Patient has had 1 COVID vaccine.   Social Determinants of Health Food Insecurity: denies food insecurity Transportation: Patient denies transportation needs.  First visit review I reviewed new OB appt with pt. I explained she will have blood work and pap smear/pelvic exam if indicated. Explained pt will be seen by Estil Mangle, DO at first visit; encounter routed to appropriate provider.   Beola Skeens, CMA 07/07/2023  2:45 PM

## 2023-07-07 NOTE — Patient Instructions (Signed)
 Commonly Asked Questions During Pregnancy  Cats: A parasite can be excreted in cat feces.  To avoid exposure you need to have another person empty the little box.  If you must empty the litter box you will need to wear gloves.  Wash your hands after handling your cat.  This parasite can also be found in raw or undercooked meat so this should also be avoided.  Colds, Sore Throats, Flu: Please check your medication sheet to see what you can take for symptoms.  If your symptoms are unrelieved by these medications please call the office.  Dental Work: Most any dental work Agricultural consultant recommends is permitted.  X-rays should only be taken during the first trimester if absolutely necessary.  Your abdomen should be shielded with a lead apron during all x-rays.  Please notify your provider prior to receiving any x-rays.  Novocaine is fine; gas is not recommended.  If your dentist requires a note from [place] prior to dental work please call the office and we will provide one for you.  Exercise: Exercise is an important part of staying healthy during your pregnancy.  You may continue most exercises you were accustomed to prior to pregnancy.  Later in your pregnancy you will most likely notice you have difficulty with activities requiring balance like riding a bicycle.  It is important that you listen to your body and avoid activities that put you at a higher risk of falling.  Adequate rest and staying well hydrated are a must!  If you have questions about the safety of specific activities ask your provider.    Exposure to Children with illness: Try to avoid obvious exposure; report any symptoms to [place] when noted,  If you have chicken pos, red measles or mumps, you should be immune to these diseases.   Please do not take any vaccines while pregnant unless you have checked with your OB provider.  Fetal Movement: After 28 weeks we recommend you do "kick counts" twice daily.  Lie or sit down in a calm quiet environment and  count your baby movements "kicks".  You should feel your baby at least 10 times per hour.  If you have not felt 10 kicks within the first hour get up, walk around and have something sweet to eat or drink then repeat for an additional hour.  If count remains less than 10 per hour notify your provider.  Fumigating: Follow your pest control agent's advice as to how long to stay out of your home.  Ventilate the area well before re-entering.  Hemorrhoids:   Most over-the-counter preparations can be used during pregnancy.  Check your medication to see what is safe to use.  It is important to use a stool softener or fiber in your diet and to drink lots of liquids.  If hemorrhoids seem to be getting worse please call the office.   Hot Tubs:  Hot tubs Jacuzzis and saunas are not recommended while pregnant.  These increase your internal body temperature and should be avoided.  Intercourse:  Sexual intercourse is safe during pregnancy as long as you are comfortable, unless otherwise advised by your provider.  Spotting may occur after intercourse; report any bright red bleeding that is heavier than spotting.  Labor:  If you know that you are in labor, please go to the hospital.  If you are unsure, please call the office and let us help you decide what to do.  Lifting, straining, etc:  If your job requires heavy  lifting or straining please check with your provider for any limitations.  Generally, you should not lift items heavier than that you can lift simply with your hands and arms (no back muscles)  Painting:  Paint fumes do not harm your pregnancy, but may make you ill and should be avoided if possible.  Latex or water based paints have less odor than oils.  Use adequate ventilation while painting.  Permanents & Hair Color:  Chemicals in hair dyes are not recommended as they cause increase hair dryness which can increase hair loss during pregnancy.  " Highlighting" and permanents are allowed.  Dye may be  absorbed differently and permanents may not hold as well during pregnancy.  Sunbathing:  Use a sunscreen, as skin burns easily during pregnancy.  Drink plenty of fluids; avoid over heating.  Tanning Beds:  Because their possible side effects are still unknown, tanning beds are not recommended.  Ultrasound Scans:  Routine ultrasounds are performed at approximately 20 weeks.  You will be able to see your baby's general anatomy an if you would like to know the gender this can usually be determined as well.  If it is questionable when you conceived you may also receive an ultrasound early in your pregnancy for dating purposes.  Otherwise ultrasound exams are not routinely performed unless there is a medical necessity.  Although you can request a scan we ask that you pay for it when conducted because insurance does not cover " patient request" scans.  Work: If your pregnancy proceeds without complications you may work until your due date, unless your physician or employer advises otherwise.  Round Ligament Pain/Pelvic Discomfort:  Sharp, shooting pains not associated with bleeding are fairly common, usually occurring in the second trimester of pregnancy.  They tend to be worse when standing up or when you remain standing for long periods of time.  These are the result of pressure of certain pelvic ligaments called "round ligaments".  Rest, Tylenol and heat seem to be the most effective relief.  As the womb and fetus grow, they rise out of the pelvis and the discomfort improves.  Please notify the office if your pain seems different than that described.  It may represent a more serious condition.  Common Medications Safe in Pregnancy  Acne:      Constipation:  Benzoyl Peroxide     Colace  Clindamycin      Dulcolax Suppository  Topica Erythromycin     Fibercon  Salicylic Acid      Metamucil         Miralax AVOID:        Senakot   Accutane    Cough:  Retin-A       Cough  Drops  Tetracycline      Phenergan w/ Codeine if Rx  Minocycline      Robitussin (Plain & DM)  Antibiotics:     Crabs/Lice:  Ceclor       RID  Cephalosporins    AVOID:  E-Mycins      Kwell  Keflex  Macrobid/Macrodantin   Diarrhea:  Penicillin      Kao-Pectate  Zithromax      Imodium AD         PUSH FLUIDS AVOID:       Cipro     Fever:  Tetracycline      Tylenol (Regular or Extra  Minocycline       Strength)  Levaquin      Extra Strength-Do not  Exceed 8 tabs/24 hrs Caffeine:        200mg /day (equiv. To 1 cup of coffee or  approx. 3 12 oz sodas)         Gas: Cold/Hayfever:       Gas-X  Benadryl      Mylicon  Claritin       Phazyme  **Claritin-D        Chlor-Trimeton    Headaches:  Dimetapp      ASA-Free Excedrin  Drixoral-Non-Drowsy     Cold Compress  Mucinex (Guaifenasin)     Tylenol (Regular or Extra  Sudafed/Sudafed-12 Hour     Strength)  **Sudafed PE Pseudoephedrine   Tylenol Cold & Sinus     Vicks Vapor Rub  Zyrtec  **AVOID if Problems With Blood Pressure         Heartburn: Avoid lying down for at least 1 hour after meals  Aciphex      Maalox     Rash:  Milk of Magnesia     Benadryl    Mylanta       1% Hydrocortisone Cream  Pepcid  Pepcid Complete   Sleep Aids:  Prevacid      Ambien   Prilosec       Benadryl  Rolaids       Chamomile Tea  Tums (Limit 4/day)     Unisom         Tylenol PM         Warm milk-add vanilla or  Hemorrhoids:       Sugar for taste  Anusol/Anusol H.C.  (RX: Analapram 2.5%)  Sugar Substitutes:  Hydrocortisone OTC     Ok in moderation  Preparation H      Tucks        Vaseline lotion applied to tissue with wiping    Herpes:     Throat:  Acyclovir      Oragel  Famvir  Valtrex     Vaccines:         Flu Shot Leg Cramps:       *Gardasil  Benadryl      Hepatitis A         Hepatitis B Nasal Spray:       Pneumovax  Saline Nasal Spray     Polio Booster         Tetanus Nausea:       Tuberculosis test or PPD  Vitamin  B6 25 mg TID   AVOID:    Dramamine      *Gardasil  Emetrol       Live Poliovirus  Ginger Root 250 mg QID    MMR (measles, mumps &  High Complex Carbs @ Bedtime    rebella)  Sea Bands-Accupressure    Varicella (Chickenpox)  Unisom 1/2 tab TID     *No known complications           If received before Pain:         Known pregnancy;   Darvocet       Resume series after  Lortab        Delivery  Percocet    Yeast:   Tramadol      Femstat  Tylenol 3      Gyne-lotrimin  Ultram       Monistat  Vicodin           MISC:         All Sunscreens  Hair Coloring/highlights          Insect Repellant's          (Including DEET)         Mystic Tans First Trimester of Pregnancy  The first trimester of pregnancy starts on the first day of your last monthly period until the end of week 13. This is months 1 through 3 of pregnancy. A week after a sperm fertilizes an egg, the egg will implant into the wall of the uterus and begin to develop into a baby. Body changes during your first trimester Your body goes through many changes during pregnancy. The changes usually return to normal after your baby is born. Physical changes Your breasts may grow larger and may hurt. The area around your nipples may get darker. Your periods will stop. Your hair and nails may grow faster. You may pee more often. Health changes You may tire easily. Your gums may bleed and may be sensitive when you brush and floss. You may not feel hungry. You may have heartburn. You may throw up or feel like you may throw up. You may want to eat some foods, but not others. You may have headaches. You may have trouble pooping (constipation). Other changes Your emotions may change from day to day. You may have more dreams. Follow these instructions at home: Medicines Talk to your health care provider if you're taking medicines. Ask if the medicines are safe to take during pregnancy. Your provider may change the medicines that  you take. Do not take any medicines unless told to by your provider. Take a prenatal vitamin that has at least 600 micrograms (mcg) of folic acid. Do not use herbal medicines, illegal substances, or medicines that are not approved by your provider. Eating and drinking While you're pregnant your body needs extra food for your growing baby. Talk with your provider about what to eat while pregnant. Activity Most women are able to exercise during pregnancy. Exercises may need to change as your pregnancy goes on. Talk to your provider about your activities and exercise routines. Relieving pain and discomfort Wear a good, supportive bra if your breasts hurt. Rest with your legs raised if you have leg cramps or low back pain. Safety Wear your seatbelt at all times when you're in a car. Talk to your provider if someone hits you, hurts you, or yells at you. Talk with your provider if you're feeling sad or have thoughts of hurting yourself. Lifestyle Certain things can be harmful while you're pregnant. Follow these rules: Do not use hot tubs, steam rooms, or saunas. Do not douche. Do not use tampons or scented pads. Do not drink alcohol,smoke, vape, or use products with nicotine or tobacco in them. If you need help quitting, talk with your provider. Avoid cat litter boxes and soil used by cats. These things carry germs that can cause harm to your pregnancy and your baby. General instructions Keep all follow-up visits. It helps you and your unborn baby stay as healthy as possible. Write down your questions. Take them to your visits. Your provider will: Talk with you about your overall health. Give you advice or refer you to specialists who can help with different needs, including: Prenatal education classes. Mental health and counseling. Foods and healthy eating. Ask for help if you need help with food. Call your dentist and ask to be seen. Brush your teeth with a soft toothbrush. Floss  gently. Where to find more information American Pregnancy Association: americanpregnancy.org  [employer]: acog.org Office on [employer] Health: [employer] Contact a health care provider if: You feel dizzy, faint, or have a fever. You vomit or have watery poop (diarrhea) for 2 days or more. You have abnormal discharge or bleeding from your vagina. You have pain when you pee or your pee smells bad. You have cramps, pain, or pressure in your belly area. Get help right away if: You have trouble breathing or chest pain. You have any kind of injury, such as from a fall or a car crash. These symptoms may be an emergency. Get help right away. Call 911. Do not wait to see if the symptoms will go away. Do not drive yourself to the hospital. This information is not intended to replace advice given to you by your health care provider. Make sure you discuss any questions you have with your health care provider. Document Revised: 02/16/2023 Document Reviewed: 09/16/2022 Elsevier Patient Education  2024 [employer].

## 2023-07-10 ENCOUNTER — Ambulatory Visit: Payer: 59

## 2023-07-10 VITALS — BP 148/83 | HR 106 | Ht 61.0 in | Wt 243.6 lb

## 2023-07-10 DIAGNOSIS — Z8632 Personal history of gestational diabetes: Secondary | ICD-10-CM

## 2023-07-10 DIAGNOSIS — O10919 Unspecified pre-existing hypertension complicating pregnancy, unspecified trimester: Secondary | ICD-10-CM

## 2023-07-10 DIAGNOSIS — Z0130 Encounter for examination of blood pressure without abnormal findings: Secondary | ICD-10-CM

## 2023-07-10 MED ORDER — NIFEDIPINE ER OSMOTIC RELEASE 90 MG PO TB24: 90.0000 mg | ORAL_TABLET | Freq: Every day | ORAL | 11 refills | Status: DC

## 2023-07-10 MED ORDER — BLOOD GLUCOSE TEST VI STRP: 1.0000 | ORAL_STRIP | Freq: Three times a day (TID) | 0 refills | Status: AC

## 2023-07-10 MED ORDER — LANCETS MISC. MISC: 1.0000 | Freq: Three times a day (TID) | 0 refills | Status: AC

## 2023-07-10 MED ORDER — BLOOD GLUCOSE MONITORING SUPPL DEVI: 1.0000 | Freq: Three times a day (TID) | 0 refills | Status: DC

## 2023-07-10 MED ORDER — LANCET DEVICE MISC: 1.0000 | Freq: Three times a day (TID) | 0 refills | Status: AC

## 2023-07-10 NOTE — Progress Notes (Signed)
    NURSE VISIT NOTE  Subjective:    Patient ID: Wei Lohmeier, female    DOB: 05-14-1986, 38 y.o.   MRN: [id number]  HPI  Patient is a 38 y.o. G19P1021 female who presents for BP check per order from Josue Nip, CNM.   Patient reports compliance with prescribed BP medications: yes Procardia  60 mgdaily Last dose of BP medication:  07/10/23 at 9 am  BP Readings from Last 3 Encounters:  07/03/23 (!) 159/80  06/26/23 (!) 145/88  06/23/23 (!) 170/97   Pulse Readings from Last 3 Encounters:  07/03/23 (!) 103  06/26/23 74  06/23/23 (!) 102    Objective:    Ht 5\' 1"  (1.549 m)   Wt 243 lb 9.6 oz (110.5 kg)   LMP 05/03/2023 (Exact Date)   BMI 46.03 kg/m   Assessment:   1. BP check   2. Chronic hypertension affecting pregnancy      Plan:   Per Quince Bryant, CNM Treatment: Increase dose of Procardia  60 mg daily to Procardia  90 mg daily   Continue current treatment regimen. Continue to monitor blood pressure at home. Report any reading >140/90 or with any associated symptoms. Follow- up in one week for BP Check Patient verbalized understanding of instructions.   Vale Garrison, CMA

## 2023-07-13 ENCOUNTER — Other Ambulatory Visit: Payer: Self-pay | Admitting: Obstetrics

## 2023-07-13 DIAGNOSIS — O3680X0 Pregnancy with inconclusive fetal viability, not applicable or unspecified: Secondary | ICD-10-CM

## 2023-07-17 ENCOUNTER — Other Ambulatory Visit: Payer: 59

## 2023-07-17 ENCOUNTER — Telehealth: Payer: Self-pay | Admitting: Obstetrics

## 2023-07-17 NOTE — Telephone Encounter (Signed)
Reached out to pt about dating scan that was scheduled on 07/17/2023 at 2:30.  Pt will need to be given the number for Centralized Scheduling to schedule the [place].  Left message for pt to call back.

## 2023-07-18 NOTE — Telephone Encounter (Signed)
Pt does not need to have the dating scan.  She had a scan on 06/23/2023.

## 2023-07-24 ENCOUNTER — Ambulatory Visit (INDEPENDENT_AMBULATORY_CARE_PROVIDER_SITE_OTHER): Payer: 59 | Admitting: Obstetrics

## 2023-07-24 ENCOUNTER — Encounter: Payer: Self-pay | Admitting: Obstetrics

## 2023-07-24 ENCOUNTER — Other Ambulatory Visit (HOSPITAL_COMMUNITY)
Admission: RE | Admit: 2023-07-24 | Discharge: 2023-07-24 | Disposition: A | Discharge status: Home / Self Care | Payer: 59 | Source: Ambulatory Visit | Attending: Obstetrics | Admitting: Obstetrics

## 2023-07-24 VITALS — BP 146/74 | HR 104 | Ht 61.0 in | Wt 240.0 lb

## 2023-07-24 DIAGNOSIS — F17210 Nicotine dependence, cigarettes, uncomplicated: Secondary | ICD-10-CM | POA: Diagnosis not present

## 2023-07-24 DIAGNOSIS — Z113 Encounter for screening for infections with a predominantly sexual mode of transmission: Secondary | ICD-10-CM

## 2023-07-24 DIAGNOSIS — Z98891 History of uterine scar from previous surgery: Secondary | ICD-10-CM

## 2023-07-24 DIAGNOSIS — Z8632 Personal history of gestational diabetes: Secondary | ICD-10-CM

## 2023-07-24 DIAGNOSIS — O10919 Unspecified pre-existing hypertension complicating pregnancy, unspecified trimester: Secondary | ICD-10-CM

## 2023-07-24 DIAGNOSIS — O10011 Pre-existing essential hypertension complicating pregnancy, first trimester: Secondary | ICD-10-CM | POA: Diagnosis not present

## 2023-07-24 DIAGNOSIS — Z3A11 11 weeks gestation of pregnancy: Secondary | ICD-10-CM

## 2023-07-24 DIAGNOSIS — O09521 Supervision of elderly multigravida, first trimester: Secondary | ICD-10-CM | POA: Diagnosis not present

## 2023-07-24 DIAGNOSIS — Z124 Encounter for screening for malignant neoplasm of cervix: Secondary | ICD-10-CM | POA: Insufficient documentation

## 2023-07-24 DIAGNOSIS — O99331 Smoking (tobacco) complicating pregnancy, first trimester: Secondary | ICD-10-CM | POA: Diagnosis not present

## 2023-07-24 DIAGNOSIS — Z1379 Encounter for other screening for genetic and chromosomal anomalies: Secondary | ICD-10-CM

## 2023-07-24 DIAGNOSIS — O0990 Supervision of high risk pregnancy, unspecified, unspecified trimester: Secondary | ICD-10-CM

## 2023-07-24 DIAGNOSIS — Z6841 Body Mass Index (BMI) 40.0 and over, adult: Secondary | ICD-10-CM

## 2023-07-24 DIAGNOSIS — Z3491 Encounter for supervision of normal pregnancy, unspecified, first trimester: Secondary | ICD-10-CM
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
## 2023-07-25 ENCOUNTER — Encounter: Payer: Self-pay | Admitting: Obstetrics

## 2023-07-25 LAB — HEMOGLOBIN A1C
Est. average glucose Bld gHb Est-mCnc: 134 mg/dL
Hgb A1c MFr Bld: 6.3 % — ABNORMAL HIGH (ref 4.8–5.6)

## 2023-07-25 LAB — URINALYSIS, ROUTINE W REFLEX MICROSCOPIC
Bilirubin, UA: NEGATIVE
Glucose, UA: NEGATIVE
Ketones, UA: NEGATIVE
Leukocytes,UA: NEGATIVE
Nitrite, UA: NEGATIVE
Protein,UA: NEGATIVE
RBC, UA: NEGATIVE
Specific Gravity, UA: 1.02 (ref 1.005–1.030)
Urobilinogen, Ur: 0.2 mg/dL (ref 0.2–1.0)
pH, UA: 6 (ref 5.0–7.5)

## 2023-07-25 LAB — CBC/D/PLT+RPR+RH+ABO+RUBIGG...
Antibody Screen: NEGATIVE
Basophils Absolute: 0 10*3/uL (ref 0.0–0.2)
Basos: 0 %
EOS (ABSOLUTE): 0.2 10*3/uL (ref 0.0–0.4)
Eos: 2 %
HCV Ab: NONREACTIVE
HIV Screen 4th Generation wRfx: NONREACTIVE
Hematocrit: 38.7 % (ref 34.0–46.6)
Hemoglobin: 13 g/dL (ref 11.1–15.9)
Hepatitis B Surface Ag: NEGATIVE
Immature Grans (Abs): 0 10*3/uL (ref 0.0–0.1)
Immature Granulocytes: 0 %
Lymphocytes Absolute: 2.6 10*3/uL (ref 0.7–3.1)
Lymphs: 26 %
MCH: 26.9 pg (ref 26.6–33.0)
MCHC: 33.6 g/dL (ref 31.5–35.7)
MCV: 80 fL (ref 79–97)
Monocytes Absolute: 0.6 10*3/uL (ref 0.1–0.9)
Monocytes: 6 %
Neutrophils Absolute: 6.5 10*3/uL (ref 1.4–7.0)
Neutrophils: 66 %
Platelets: 236 10*3/uL (ref 150–450)
RBC: 4.83 x10E6/uL (ref 3.77–5.28)
RDW: 13.9 % (ref 11.7–15.4)
RPR Ser Ql: NONREACTIVE
Rh Factor: POSITIVE
Rubella Antibodies, IGG: 2.54 {index} (ref >0.99)
Varicella zoster IgG: REACTIVE
WBC: 10 10*3/uL (ref 3.4–10.8)

## 2023-07-25 LAB — COMPREHENSIVE METABOLIC PANEL
ALT: 11 [IU]/L (ref 0–32)
AST: 13 [IU]/L (ref 0–40)
Albumin: 3.7 g/dL — ABNORMAL LOW (ref 3.9–4.9)
Alkaline Phosphatase: 84 [IU]/L (ref 44–121)
BUN/Creatinine Ratio: 17 (ref 9–23)
BUN: 8 mg/dL (ref 6–20)
Bilirubin Total: <0.2 mg/dL (ref 0.0–1.2)
CO2: 19 mmol/L — ABNORMAL LOW (ref 20–29)
Calcium: 8.7 mg/dL (ref 8.7–10.2)
Chloride: 102 mmol/L (ref 96–106)
Creatinine, Ser: 0.47 mg/dL — ABNORMAL LOW (ref 0.57–1.00)
Globulin, Total: 2.9 g/dL (ref 1.5–4.5)
Glucose: 113 mg/dL — ABNORMAL HIGH (ref 70–99)
Potassium: 3.6 mmol/L (ref 3.5–5.2)
Sodium: 136 mmol/L (ref 134–144)
Total Protein: 6.6 g/dL (ref 6.0–8.5)
eGFR: 124 mL/min/{1.73_m2} (ref >59)

## 2023-07-25 LAB — PROTEIN / CREATININE RATIO, URINE
Creatinine, Urine: 98.7 mg/dL
Protein, Ur: 11.5 mg/dL
Protein/Creat Ratio: 117 mg/g{creat} (ref 0–200)

## 2023-07-25 LAB — HCV INTERPRETATION

## 2023-07-26 LAB — MONITOR DRUG PROFILE 14(MW)
Amphetamine Scrn, Ur: NEGATIVE ng/mL
BARBITURATE SCREEN URINE: NEGATIVE ng/mL
BENZODIAZEPINE SCREEN, URINE: NEGATIVE ng/mL
Buprenorphine, Urine: NEGATIVE ng/mL
CANNABINOIDS UR QL SCN: NEGATIVE ng/mL
Cocaine (Metab) Scrn, Ur: NEGATIVE ng/mL
Creatinine(Crt), U: 106.4 mg/dL (ref 20.0–300.0)
Fentanyl, Urine: NEGATIVE pg/mL
Meperidine Screen, Urine: NEGATIVE ng/mL
Methadone Screen, Urine: NEGATIVE ng/mL
OXYCODONE+OXYMORPHONE UR QL SCN: NEGATIVE ng/mL
Opiate Scrn, Ur: NEGATIVE ng/mL
Ph of Urine: 5.6 (ref 4.5–8.9)
Phencyclidine Qn, Ur: NEGATIVE ng/mL
Propoxyphene Scrn, Ur: NEGATIVE ng/mL
SPECIFIC GRAVITY: 1.015
Tramadol Screen, Urine: NEGATIVE ng/mL

## 2023-07-26 LAB — CULTURE, OB URINE

## 2023-07-26 LAB — URINE CULTURE, OB REFLEX

## 2023-07-26 LAB — CERVICOVAGINAL ANCILLARY ONLY
Chlamydia: NEGATIVE
Comment: NEGATIVE
Comment: NEGATIVE
Comment: NORMAL
Neisseria Gonorrhea: NEGATIVE
Trichomonas: NEGATIVE

## 2023-07-26 LAB — NICOTINE SCREEN, URINE: Cotinine Ql Scrn, Ur: POSITIVE ng/mL — AB

## 2023-07-29 LAB — MATERNIT 21 PLUS CORE, BLOOD
Fetal Fraction: 10
Result (T21): NEGATIVE
Trisomy 13 (Patau syndrome): NEGATIVE
Trisomy 18 (Edwards syndrome): NEGATIVE
Trisomy 21 (Down syndrome): NEGATIVE

## 2023-07-31 ENCOUNTER — Other Ambulatory Visit: Payer: 59

## 2023-07-31 ENCOUNTER — Encounter: Payer: Self-pay | Admitting: Obstetrics

## 2023-08-01 LAB — GLUCOSE TOLERANCE, 1 HOUR: Glucose, 1Hr PP: 241 mg/dL — ABNORMAL HIGH (ref 70–199)

## 2023-08-02 ENCOUNTER — Other Ambulatory Visit: Payer: Self-pay | Admitting: Obstetrics

## 2023-08-02 ENCOUNTER — Encounter: Payer: Self-pay | Admitting: Obstetrics

## 2023-08-02 DIAGNOSIS — O24311 Unspecified pre-existing diabetes mellitus in pregnancy, first trimester: Secondary | ICD-10-CM

## 2023-08-02 MED ORDER — LANCET DEVICE MISC: 1.0000 | Freq: Four times a day (QID) | 0 refills | Status: AC

## 2023-08-02 MED ORDER — BLOOD GLUCOSE TEST VI STRP: 1.0000 | ORAL_STRIP | Freq: Four times a day (QID) | 0 refills | Status: AC

## 2023-08-02 MED ORDER — BLOOD GLUCOSE MONITORING SUPPL DEVI: 1.0000 | Freq: Four times a day (QID) | 0 refills | Status: DC

## 2023-08-02 MED ORDER — LANCETS MISC. MISC: 1.0000 | Freq: Four times a day (QID) | 0 refills | Status: AC

## 2023-08-08 ENCOUNTER — Telehealth: Payer: Self-pay

## 2023-08-08 ENCOUNTER — Inpatient Hospital Stay: Admission: RE | Admit: 2023-08-08 | Payer: 59 | Source: Ambulatory Visit

## 2023-08-10 ENCOUNTER — Ambulatory Visit: Admitting: Dietician

## 2023-08-22 ENCOUNTER — Telehealth: Payer: Self-pay | Admitting: *Deleted

## 2023-08-22 ENCOUNTER — Telehealth: Payer: Self-pay

## 2023-08-22 DIAGNOSIS — O24419 Gestational diabetes mellitus in pregnancy, unspecified control: Secondary | ICD-10-CM

## 2023-08-22 NOTE — Progress Notes (Unsigned)
 Complex Care Management Note Care Guide Note  08/22/2023 Name: Olivia Alexander MRN: [id number] DOB: 09/23/1985   Complex Care Management Outreach Attempts: An unsuccessful telephone outreach was attempted today to offer the patient information about available complex care management services.  Follow Up Plan:  Additional outreach attempts will be made to offer the patient complex care management information and services.   Encounter Outcome:  No Answer  Burman Nieves, CMA [place]  [facility], [facility] Guide Direct Dial: [phone]  Fax: [phone] Website: [email or web address]

## 2023-08-23 ENCOUNTER — Encounter: Admitting: Certified Nurse Midwife

## 2023-08-23 NOTE — Progress Notes (Unsigned)
 Complex Care Management Note Care Guide Note  08/23/2023 Name: Olivia Alexander MRN: [id number] DOB: November 17, 1985   Complex Care Management Outreach Attempts: A second unsuccessful outreach was attempted today to offer the patient with information about available complex care management services.  Follow Up Plan:  Additional outreach attempts will be made to offer the patient complex care management information and services.   Encounter Outcome:  No Answer  Burman Nieves, CMA [place]  [facility], [facility] Guide Direct Dial: [phone]  Fax: [phone] Website: [email or web address]

## 2023-08-24 NOTE — Progress Notes (Signed)
 Complex Care Management Note Care Guide Note  08/24/2023 Name: Olivia Alexander MRN: [id number] DOB: June 18, 1985   Complex Care Management Outreach Attempts: A third unsuccessful outreach was attempted today to offer the patient with information about available complex care management services.  Follow Up Plan:  No further outreach attempts will be made at this time. We have been unable to contact the patient to offer or enroll patient in complex care management services.  Encounter Outcome:  No Answer  Burman Nieves, CMA [place]  [facility], [facility] Guide Direct Dial: [phone]  Fax: [phone] Website: [email or web address]

## 2023-08-28 ENCOUNTER — Encounter
Admission: RE | Admit: 2023-08-28 | Discharge: 2023-08-28 | Disposition: A | Discharge status: Home / Self Care | Payer: Self-pay | Source: Ambulatory Visit | Attending: Obstetrics | Admitting: Obstetrics

## 2023-08-28 NOTE — H&P (Signed)
 [facility] Anesthesia Consultation  Olivia Alexander AVW:[id number] DOB: 1986/03/02 DOA: 08/28/2023 PCP: Pcp, No   Requesting physician:  Date of consultation: Aug 28 2023 Reason for consultation: Obesity during pregnancy  CHIEF COMPLAINT:  Obesity during pregnancy  HISTORY OF PRESENT ILLNESS: Olivia Alexander  is a 38 y.o. female with a known history of obesity and HTN with borderline DM  PAST MEDICAL HISTORY:   Past Medical History:  Diagnosis Date   Anemia    Hypertension affecting pregnancy     PAST SURGICAL HISTORY:  Past Surgical History:  Procedure Laterality Date   CESAREAN SECTION  08/12/2010    SOCIAL HISTORY:  Social History   Tobacco Use   Smoking status: Every Day    Current packs/day: 1.00    Types: Cigarettes   Smokeless tobacco: Never  Substance Use Topics   Alcohol use: Not Currently    FAMILY HISTORY:  Family History  Problem Relation Age of Onset   Healthy Mother    Hypertension Father    Other Maternal Grandmother        Gallbladder cancer   Breast cancer Paternal Grandmother        late years    DRUG ALLERGIES: No Known Allergies  REVIEW OF SYSTEMS:   RESPIRATORY: No cough, shortness of breath, wheezing.  CARDIOVASCULAR: No chest pain, orthopnea, edema.  HEMATOLOGY: No anemia, easy bruising or bleeding SKIN: No rash or lesion. NEUROLOGIC: No tingling, numbness, weakness.  PSYCHIATRY: No anxiety or depression.   MEDICATIONS AT HOME:  Prior to Admission medications   Medication Sig Start Date End Date Taking? Authorizing Provider  albuterol (VENTOLIN HFA) 108 (90 Base) MCG/ACT inhaler Inhale 2 puffs into the lungs every 6 (six) hours as needed for wheezing or shortness of breath. 06/04/23   Gladys Damme, PA-C  aspirin EC 81 MG tablet Take 1 tablet (81 mg total) by mouth daily. Swallow whole. Start at 13 weeks Patient not taking: Reported on 07/07/2023 06/26/23   Glenetta Borg, CNM  Glucose Blood  (BLOOD GLUCOSE TEST STRIPS) STRP 1 each by In Vitro route 4 (four) times daily. Fasting, and 1 or 2 hours after each meal 08/02/23 09/01/23  Julieanne Manson, MD  Lancet Device MISC 1 each by Does not apply route 4 (four) times daily. 08/02/23 09/01/23  Julieanne Manson, MD  Lancets Misc. MISC 1 each by Does not apply route 4 (four) times daily. Fasting and TID 1 or 2 hrs after each meal 08/02/23 09/01/23  Julieanne Manson, MD  NIFEdipine (ADALAT CC) 30 MG 24 hr tablet Take 30 mg by mouth daily. Patient not taking: Reported on 07/24/2023 06/28/23   [provider]  NIFEdipine (PROCARDIA XL/NIFEDICAL-XL) 90 MG 24 hr tablet Take 1 tablet (90 mg total) by mouth daily. Patient not taking: Reported on 07/24/2023 07/10/23   Dominica Severin, CNM  Prenatal MV & Min w/FA-DHA ([employer] PRENATAL GUMMY) 0.18-25 MG CHEW Chew 1 tablet by mouth daily. Patient not taking: Reported on 07/24/2023 06/26/23   Glenetta Borg, CNM  FLUoxetine (PROZAC) 20 MG tablet Take 20 mg by mouth daily.  06/28/19  [provider]      PHYSICAL EXAMINATION:   VITAL SIGNS: Height 5\' 1"  (1.549 m), weight 104.8 kg, last menstrual period 05/03/2023.  GENERAL:  38 y.o.-year-old patient no acute distress.  HEENT: Head atraumatic, normocephalic. Oropharynx and nasopharynx clear. MP 1, TM distance >3 cm, normal mouth opening. LUNGS: No use of accessory muscles of respiration.   EXTREMITIES: No pedal  edema, cyanosis, or clubbing.  NEUROLOGIC: normal gait PSYCHIATRIC: The patient is alert and oriented x 3.  SKIN: No obvious rash, lesion, or ulcer.    IMPRESSION AND PLAN:   Olivia Alexander  is a 38 y.o. female presenting with obesity during pregnancy. BMI is currently 44 at [redacted] weeks gestation.   We discussed analgesic options during labor including epidural analgesia. Discussed that in obesity there can be increased difficulty with epidural placement or even failure of successful epidural. We also discussed that even after successful epidural  placement there is increased risk of catheter migration out of the epidural space that would require catheter replacement. Discussed use of epidural vs spinal vs GA if cesarean delivery is required. Discussed increased risk of difficult intubation during pregnancy should an emergency cesarean delivery be required.   We discussed repeat evaluation at 35-36 weeks by anesthesia to determine whether there is a high risk of complications of anesthesia for which we would recommend transfer of OB care to a facility with a higher maternal level of care designation.

## 2023-09-04 ENCOUNTER — Ambulatory Visit (INDEPENDENT_AMBULATORY_CARE_PROVIDER_SITE_OTHER): Admitting: Licensed Practical Nurse

## 2023-09-04 VITALS — BP 145/84 | HR 103 | Wt 229.7 lb

## 2023-09-04 DIAGNOSIS — O24012 Pre-existing diabetes mellitus, type 1, in pregnancy, second trimester: Secondary | ICD-10-CM | POA: Diagnosis not present

## 2023-09-04 DIAGNOSIS — O0990 Supervision of high risk pregnancy, unspecified, unspecified trimester: Secondary | ICD-10-CM

## 2023-09-04 DIAGNOSIS — O24912 Unspecified diabetes mellitus in pregnancy, second trimester: Secondary | ICD-10-CM

## 2023-09-04 DIAGNOSIS — Z3A17 17 weeks gestation of pregnancy: Secondary | ICD-10-CM

## 2023-09-04 DIAGNOSIS — O10919 Unspecified pre-existing hypertension complicating pregnancy, unspecified trimester: Secondary | ICD-10-CM | POA: Diagnosis not present

## 2023-09-04 DIAGNOSIS — O24112 Pre-existing diabetes mellitus, type 2, in pregnancy, second trimester: Secondary | ICD-10-CM | POA: Diagnosis not present

## 2023-09-04 MED ORDER — NIFEDIPINE ER OSMOTIC RELEASE 60 MG PO TB24: 60.0000 mg | ORAL_TABLET | Freq: Two times a day (BID) | ORAL | 6 refills | Status: DC

## 2023-09-04 MED ORDER — METFORMIN HCL 500 MG PO TABS: 1000.0000 mg | ORAL_TABLET | Freq: Two times a day (BID) | ORAL | 5 refills | Status: DC

## 2023-09-04 NOTE — Progress Notes (Signed)
    Return Prenatal Note   Subjective   38 y.o. Z6X0960 at [redacted]w[redacted]d presents for this follow-up prenatal visit.  Patient Doing ok, admits to struggling with mood-has a provider that she is working with, she mainly worried about being a good mom. 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 Has been checking her blood sugars, has not been checking her BP, but recently found  BP cuff-she plans to get batteries.  Patient reports: Movement: Present Contractions: Not present  Objective   Flow sheet Vitals: Pulse Rate: (!) 103 BP: (!) 145/84 Fetal Heart Rate (bpm): 160 Total weight gain: -7 lb 4.8 oz (-3.311 kg)  General Appearance  No acute distress, well appearing, and well nourished Pulmonary   Normal work of breathing Neurologic   Alert and oriented to person, place, and time Psychiatric   Mood and affect within normal limits  Assessment/Plan   Plan  38 y.o. [id number] at [redacted]w[redacted]d presents for follow-up OB visit. Reviewed prenatal record including previous visit note.  Chronic hypertension affecting pregnancy -Reviewed with Dr Lonny Prude, will increase Procardia to 60mg  BID  -Instructed pt to monitor blood pressures at home  Diabetes mellitus affecting pregnancy -checking glucose 4 times a day, fasting and after 2 meals but not consistent with timing and at bedtime -fasting's 93-122, after meals 87-198 -reviewed with Dr Lonny Prude, will start metformin, with the goal of reaching 1,000mg  BID, will start 500mg  daily and taper to prescribed dose.  -has nutrition visit in 2 weeks, instructed to keep food diary and blood glucose log sheet given   Supervision of high risk pregnancy, antepartum -anatomy US 4/25 -had anesthesia consult -is taking ASA  -return in 2 weeks to review BP and blood glucose log      No orders of the defined types were placed in this encounter.  No follow-ups on file.   Future Appointments  Date Time Provider Department Center  09/18/2023 10:55 AM  Allie Bossier, MD [facility] None  09/22/2023 10:00 AM [facility]    For next visit:   return in 2 weeks      Ellouise Newer [facility], CNM  09/04/2510:39 PM;a

## 2023-09-04 NOTE — Assessment & Plan Note (Signed)
-  checking glucose 4 times a day, fasting and after 2 meals but not consistent with timing and at bedtime -fasting's 93-122, after meals 87-198 -reviewed with Dr Lonny Prude, will start metformin, with the goal of reaching 1,000mg  BID, will start 500mg  daily and taper to prescribed dose.  -has nutrition visit in 2 weeks, instructed to keep food diary and blood glucose log sheet given

## 2023-09-04 NOTE — Assessment & Plan Note (Addendum)
-  anatomy US 4/25 -had anesthesia consult -is taking ASA  -return in 2 weeks to review BP and blood glucose log

## 2023-09-04 NOTE — Assessment & Plan Note (Signed)
-  Reviewed with Dr Lonny Prude, will increase Procardia to 60mg  BID  -Instructed pt to monitor blood pressures at home

## 2023-09-18 ENCOUNTER — Encounter: Admitting: Obstetrics & Gynecology

## 2023-09-18 ENCOUNTER — Telehealth: Payer: Self-pay | Admitting: Certified Nurse Midwife

## 2023-09-18 NOTE — Telephone Encounter (Signed)
 Reached out to pt to reschedule ROB appt that was scheduled on 09/18/2023 at 10:15 with E. Slaughterbeck.  Was able to reschedule the appt to 4/22/205 at 3:35 with JEG.

## 2023-09-19 ENCOUNTER — Encounter: Admitting: Advanced Practice Midwife

## 2023-09-20 ENCOUNTER — Ambulatory Visit (INDEPENDENT_AMBULATORY_CARE_PROVIDER_SITE_OTHER): Admitting: Certified Nurse Midwife

## 2023-09-20 VITALS — BP 154/90 | HR 100 | Wt 229.7 lb

## 2023-09-20 DIAGNOSIS — F32A Depression, unspecified: Secondary | ICD-10-CM | POA: Diagnosis not present

## 2023-09-20 DIAGNOSIS — O24912 Unspecified diabetes mellitus in pregnancy, second trimester: Secondary | ICD-10-CM | POA: Diagnosis not present

## 2023-09-20 DIAGNOSIS — Z3480 Encounter for supervision of other normal pregnancy, unspecified trimester: Secondary | ICD-10-CM | POA: Insufficient documentation

## 2023-09-20 DIAGNOSIS — O99342 Other mental disorders complicating pregnancy, second trimester: Secondary | ICD-10-CM

## 2023-09-20 DIAGNOSIS — O24410 Gestational diabetes mellitus in pregnancy, diet controlled: Secondary | ICD-10-CM

## 2023-09-20 DIAGNOSIS — O10012 Pre-existing essential hypertension complicating pregnancy, second trimester: Secondary | ICD-10-CM

## 2023-09-20 DIAGNOSIS — Z3A20 20 weeks gestation of pregnancy: Secondary | ICD-10-CM | POA: Diagnosis not present

## 2023-09-20 DIAGNOSIS — O10919 Unspecified pre-existing hypertension complicating pregnancy, unspecified trimester: Secondary | ICD-10-CM

## 2023-09-20 LAB — POCT URINALYSIS DIPSTICK OB
Bilirubin, UA: NEGATIVE
Blood, UA: POSITIVE
Glucose, UA: NEGATIVE
Ketones, UA: POSITIVE
Leukocytes, UA: NEGATIVE
Nitrite, UA: NEGATIVE
Spec Grav, UA: 1.015 (ref 1.010–1.025)
Urobilinogen, UA: 0.2 U/dL
pH, UA: 5 (ref 5.0–8.0)

## 2023-09-20 MED ORDER — FAMOTIDINE 20 MG PO TABS: 20.0000 mg | ORAL_TABLET | Freq: Two times a day (BID) | ORAL | 3 refills | Status: DC

## 2023-09-20 MED ORDER — LABETALOL HCL 100 MG PO TABS: 200.0000 mg | ORAL_TABLET | Freq: Two times a day (BID) | ORAL | 3 refills | Status: DC

## 2023-09-20 MED ORDER — SERTRALINE HCL 50 MG PO TABS: 50.0000 mg | ORAL_TABLET | Freq: Every day | ORAL | 6 refills | Status: AC

## 2023-09-20 NOTE — Assessment & Plan Note (Signed)
 BP today in clinic 160/90 and then 154/90. 3+ protein in urine. Patient has been taking medication. Reviewed with Dr. Dell Fennel. Will add labetalol  200mg  BID. [facility] labs collected today.

## 2023-09-20 NOTE — Assessment & Plan Note (Signed)
 Patient did not bring a log today. Reports mostly normal fasting and post prandials but unable to tell me many values. Will keep strict log for the next two weeks so we can assess her needs for medication. Has been taking metformin  regularly.

## 2023-09-20 NOTE — Progress Notes (Signed)
    Return Prenatal Note   Assessment/Plan   Plan  38 y.o. [id number] at [redacted]w[redacted]d presents for follow-up OB visit. Reviewed prenatal record including previous visit note.  Chronic hypertension affecting pregnancy BP today in clinic 160/90 and then 154/90. 3+ protein in urine. Patient has been taking medication. Reviewed with Dr. Dell Fennel. Will add labetalol  200mg  BID. [facility] labs collected today.   Diabetes mellitus affecting pregnancy Patient did not bring a log today. Reports mostly normal fasting and post prandials but unable to tell me many values. Will keep strict log for the next two weeks so we can assess her needs for medication. Has been taking metformin  regularly.   Depression Has been feeling more unstable with her mood the past few weeks. Endorses low motivation and irritability. Desires medication at this time. Has used prozac in the past but it made her feel numb. Will send RX for zoloft .    Orders Placed This Encounter  Procedures   US  Fetal Echocardiography    Dr. Joycelyn Noa, Fetal ECHO    Standing Status:   Future    Expected Date:   10/04/2023    Expiration Date:   12/29/2023    Reason for exam::   Diabetes, increased risk of cardiac malformation    Preferred imaging location?:   External   Comp Met (CMET)   Protein / Creatinine Ratio, Urine   CBC   POC Urinalysis Dipstick OB   No follow-ups on file.   Future Appointments  Date Time Provider Department Center  09/22/2023 10:00 AM [facility]  10/04/2023  3:15 PM Chau Savell, Sherline Distel, CNM [facility] None    For next visit:   Follow up in two weeks for ROB     Subjective   37 y.o. [id number] at [redacted]w[redacted]d presents for this follow-up prenatal visit.  Patient has had some episodes of feeling light headed at work. Worried about working alone on shifts.  Patient reports: Movement: Present Contractions: Not present  Objective   Flow sheet Vitals: Pulse Rate: 100 BP: (!) 154/90 Fundal Height: 20 cm Fetal Heart  Rate (bpm): 158 Total weight gain: -7 lb 4.8 oz (-3.311 kg)  General Appearance  No acute distress, well appearing, and well nourished Pulmonary   Normal work of breathing Neurologic   Alert and oriented to person, place, and time Psychiatric   Mood and affect within normal limits  Donato Fu, CNM  04/23/255:00 PM

## 2023-09-20 NOTE — Assessment & Plan Note (Signed)
 Has been feeling more unstable with her mood the past few weeks. Endorses low motivation and irritability. Desires medication at this time. Has used prozac in the past but it made her feel numb. Will send RX for zoloft .

## 2023-09-21 LAB — COMPREHENSIVE METABOLIC PANEL WITH GFR
ALT: 12 IU/L (ref 0–32)
AST: 11 IU/L (ref 0–40)
Albumin: 3.5 g/dL — ABNORMAL LOW (ref 3.9–4.9)
Alkaline Phosphatase: 91 IU/L (ref 44–121)
BUN/Creatinine Ratio: 11 (ref 9–23)
BUN: 5 mg/dL — ABNORMAL LOW (ref 6–20)
Bilirubin Total: <0.2 mg/dL (ref 0.0–1.2)
CO2: 20 mmol/L (ref 20–29)
Calcium: 8.5 mg/dL — ABNORMAL LOW (ref 8.7–10.2)
Chloride: 103 mmol/L (ref 96–106)
Creatinine, Ser: 0.45 mg/dL — ABNORMAL LOW (ref 0.57–1.00)
Globulin, Total: 2.8 g/dL (ref 1.5–4.5)
Glucose: 85 mg/dL (ref 70–99)
Potassium: 3.5 mmol/L (ref 3.5–5.2)
Sodium: 138 mmol/L (ref 134–144)
Total Protein: 6.3 g/dL (ref 6.0–8.5)
eGFR: 125 mL/min/{1.73_m2} (ref >59)

## 2023-09-21 LAB — CBC
Hematocrit: 36.6 % (ref 34.0–46.6)
Hemoglobin: 12.5 g/dL (ref 11.1–15.9)
MCH: 27.7 pg (ref 26.6–33.0)
MCHC: 34.2 g/dL (ref 31.5–35.7)
MCV: 81 fL (ref 79–97)
Platelets: 252 10*3/uL (ref 150–450)
RBC: 4.52 x10E6/uL (ref 3.77–5.28)
RDW: 13.1 % (ref 11.7–15.4)
WBC: 11.9 10*3/uL — ABNORMAL HIGH (ref 3.4–10.8)

## 2023-09-21 LAB — PROTEIN / CREATININE RATIO, URINE
Creatinine, Urine: 210.9 mg/dL
Protein, Ur: 39.1 mg/dL
Protein/Creat Ratio: 185 mg/g{creat} (ref 0–200)

## 2023-09-22 ENCOUNTER — Ambulatory Visit: Admitting: Obstetrics and Gynecology

## 2023-09-22 ENCOUNTER — Other Ambulatory Visit: Payer: Self-pay

## 2023-09-22 ENCOUNTER — Ambulatory Visit: Attending: Obstetrics

## 2023-09-22 VITALS — BP 151/81 | HR 109

## 2023-09-22 DIAGNOSIS — O10012 Pre-existing essential hypertension complicating pregnancy, second trimester: Secondary | ICD-10-CM | POA: Insufficient documentation

## 2023-09-22 DIAGNOSIS — O34219 Maternal care for unspecified type scar from previous cesarean delivery: Secondary | ICD-10-CM | POA: Insufficient documentation

## 2023-09-22 DIAGNOSIS — O99331 Smoking (tobacco) complicating pregnancy, first trimester: Secondary | ICD-10-CM

## 2023-09-22 DIAGNOSIS — Z7984 Long term (current) use of oral hypoglycemic drugs: Secondary | ICD-10-CM | POA: Insufficient documentation

## 2023-09-22 DIAGNOSIS — O24415 Gestational diabetes mellitus in pregnancy, controlled by oral hypoglycemic drugs: Secondary | ICD-10-CM

## 2023-09-22 DIAGNOSIS — Z79899 Other long term (current) drug therapy: Secondary | ICD-10-CM | POA: Diagnosis not present

## 2023-09-22 DIAGNOSIS — O99322 Drug use complicating pregnancy, second trimester: Secondary | ICD-10-CM | POA: Diagnosis not present

## 2023-09-22 DIAGNOSIS — Z7982 Long term (current) use of aspirin: Secondary | ICD-10-CM | POA: Diagnosis not present

## 2023-09-22 DIAGNOSIS — O321XX0 Maternal care for breech presentation, not applicable or unspecified: Secondary | ICD-10-CM | POA: Diagnosis not present

## 2023-09-22 DIAGNOSIS — O99212 Obesity complicating pregnancy, second trimester: Secondary | ICD-10-CM | POA: Insufficient documentation

## 2023-09-22 DIAGNOSIS — F17210 Nicotine dependence, cigarettes, uncomplicated: Secondary | ICD-10-CM | POA: Diagnosis not present

## 2023-09-22 DIAGNOSIS — O99320 Drug use complicating pregnancy, unspecified trimester: Secondary | ICD-10-CM

## 2023-09-22 DIAGNOSIS — F1910 Other psychoactive substance abuse, uncomplicated: Secondary | ICD-10-CM

## 2023-09-22 DIAGNOSIS — O10919 Unspecified pre-existing hypertension complicating pregnancy, unspecified trimester: Secondary | ICD-10-CM

## 2023-09-22 DIAGNOSIS — O99332 Smoking (tobacco) complicating pregnancy, second trimester: Secondary | ICD-10-CM | POA: Diagnosis not present

## 2023-09-22 DIAGNOSIS — Z6841 Body Mass Index (BMI) 40.0 and over, adult: Secondary | ICD-10-CM

## 2023-09-22 DIAGNOSIS — Z98891 History of uterine scar from previous surgery: Secondary | ICD-10-CM

## 2023-09-22 DIAGNOSIS — J4520 Mild intermittent asthma, uncomplicated: Secondary | ICD-10-CM | POA: Insufficient documentation

## 2023-09-22 DIAGNOSIS — O99512 Diseases of the respiratory system complicating pregnancy, second trimester: Secondary | ICD-10-CM | POA: Diagnosis not present

## 2023-09-22 DIAGNOSIS — O355XX0 Maternal care for (suspected) damage to fetus by drugs, not applicable or unspecified: Secondary | ICD-10-CM | POA: Diagnosis not present

## 2023-09-22 DIAGNOSIS — O0990 Supervision of high risk pregnancy, unspecified, unspecified trimester: Secondary | ICD-10-CM

## 2023-09-22 DIAGNOSIS — E669 Obesity, unspecified: Secondary | ICD-10-CM

## 2023-09-22 DIAGNOSIS — O09522 Supervision of elderly multigravida, second trimester: Secondary | ICD-10-CM | POA: Insufficient documentation

## 2023-09-22 DIAGNOSIS — Z36 Encounter for antenatal screening for chromosomal anomalies: Secondary | ICD-10-CM | POA: Insufficient documentation

## 2023-09-22 DIAGNOSIS — Z363 Encounter for antenatal screening for malformations: Secondary | ICD-10-CM | POA: Diagnosis not present

## 2023-09-22 DIAGNOSIS — O24912 Unspecified diabetes mellitus in pregnancy, second trimester: Secondary | ICD-10-CM

## 2023-09-22 DIAGNOSIS — Z3A20 20 weeks gestation of pregnancy: Secondary | ICD-10-CM

## 2023-09-22 DIAGNOSIS — O09521 Supervision of elderly multigravida, first trimester: Secondary | ICD-10-CM

## 2023-09-22 NOTE — Progress Notes (Signed)
  Maternal-Fetal Medicine   Name: Olivia Alexander MRN: [id number] G4 P1021 at 20w 2d gestation.  Patient is here for fetal anatomy scan.  Her high-risk pregnancy problems include: - Advanced maternal age.  On cell-free fetal DNA screening, the risks of fetal aneuploidies are not increased. - Gestational diabetes.  Early screening showed 1 hour glucose value of 240 mg/dL.  Her hemoglobin A1c was 6.3% (not consistent with type 2 diabetes).  Patient reports she has not been checking her blood glucose. She takes metformin  1,000 mg twice daily. -Pregravid BMI 44.8. - Previous cesarean delivery.  Obstetric history is significant for a term cesarean delivery in 2012 of a female infant.  Cesarean section was performed because of nonreassuring fetal heart rates.  Her son is in good health. - Chronic hypertension.  Patient takes nifedipine  XL 60 mg and labetalol  100 mg twice daily.  She takes low-dose aspirin  prophylaxis. - Mild intermittent asthma.  Well-controlled on albuterol  inhalers. - Medications.  Patient takes sertraline  50 mg daily. - Tobacco use.  Patient smokes cigarettes half pack per day. Ultrasound We performed a fetal anatomical survey.  Amniotic fluid is normal good fetal activity seen.  Fetal biometry is consistent with the previously established dates.  No markers of aneuploidies or obvious fetal structural defects are seen. Placenta is posterior and there is no evidence of previa or placenta accreta spectrum.  Our concerns include Gestational diabetes Her hemoglobin A1c was 6.3%, which is consistent with prediabetes.  Hemoglobin A1c should be 6.5 or greater for diagnosis of type 2 diabetes. I encouraged the patient to check her blood glucose regularly and discussed normal parameters.  Hyperglycemia at least 2 fetal complications including macrosomia, shoulder dystocia at delivery and neurological injuries, stillbirth, and neonatal complications including respiratory distress syndrome and  prolonged NICU stay. I discussed the benefit of exercise.  Patient may require insulin for good control.  Chronic hypertension Adverse outcomes include superimposed preeclampsia, pulmonary edema, endorgan damage and placental abruption.  I encouraged her to take antihypertensives regularly to prevent maternal complications.  I discussed timing of delivery.  Because of her comorbid conditions of diabetes and hypertension, early term delivery may be advised.  Tobacco use Nicotine  replacement therapy should be considered as cigarette has several toxic compounds.  Nicotine  patches are likely to increase quit rates.  Cigarette smoking causes complications including fetal growth restriction, placental abruption and SIDS. Medication exposure (Zoloft ) Zoloft  (sertraline ) is not associated with increased risk of fetal congenital malformations.  The absolute risk of of primary pulmonary hypertension in the newborn is very low.  Zoloft  is compatible with breast-feeding.  Previous cesarean delivery We briefly discussed VBAC.  Patient wants to have repeat cesarean delivery.  I counseled the patient that repeat cesarean delivery increases the risks of placenta previa and/or placenta accreta spectrum.  Recommendations - An appointment was made for her to return in 5 weeks for completion of fetal anatomy. - Fetal growth assessments every 4 weeks. - Weekly antenatal testing from [redacted] weeks gestation till delivery. - Consider delivery at 71- or 38-weeks' gestation. - Patient has an appointment for fetal echocardiography (she missed her previous appointment). - Consider consultation with diabetic educator and start insulin if hyperglycemia is not controlled on metformin . - Nicotine  replacement therapy counseling.  Thank you for consultation.  If you have any questions or concerns, please contact me the [facility].  Consultation including face-to-face counseling (more than 50% of time spent)  is 45 minutes.

## 2023-09-25 ENCOUNTER — Ambulatory Visit

## 2023-10-04 ENCOUNTER — Telehealth: Payer: Self-pay | Admitting: Certified Nurse Midwife

## 2023-10-04 ENCOUNTER — Encounter: Admitting: Certified Nurse Midwife

## 2023-10-04 NOTE — Telephone Encounter (Signed)
 Reached out to pt to reschedule ROB appt that was scheduled on 10/04/2023 at 3:15 with [facility].  Was able to reschedule the appt for 10/09/2023 at 2:35 with [facility].

## 2023-10-09 ENCOUNTER — Encounter: Admitting: Obstetrics

## 2023-10-09 DIAGNOSIS — O10919 Unspecified pre-existing hypertension complicating pregnancy, unspecified trimester: Secondary | ICD-10-CM

## 2023-10-09 DIAGNOSIS — O24312 Unspecified pre-existing diabetes mellitus in pregnancy, second trimester: Secondary | ICD-10-CM

## 2023-10-09 DIAGNOSIS — Z3A22 22 weeks gestation of pregnancy: Secondary | ICD-10-CM

## 2023-10-09 DIAGNOSIS — O0992 Supervision of high risk pregnancy, unspecified, second trimester: Secondary | ICD-10-CM

## 2023-10-10 ENCOUNTER — Telehealth: Payer: Self-pay | Admitting: Obstetrics

## 2023-10-10 ENCOUNTER — Encounter: Admitting: Obstetrics

## 2023-10-10 NOTE — Telephone Encounter (Signed)
 Reached out to pt to reschedule ROB appt that was scheduled on 10/10/2023 at 3:35 with Melissa Swanson.  Phone did not ring, could not leave a message.

## 2023-10-11 ENCOUNTER — Encounter: Payer: Self-pay | Admitting: Obstetrics

## 2023-10-11 NOTE — Telephone Encounter (Signed)
 Reached out to pt (2x) to reschedule ROB appt that was scheduled on 10/10/2023 at 3:35 with Melissa Swanson.  Left message for pt to call back to reschedule.  Will send a MyChart letter to pt.

## 2023-10-27 ENCOUNTER — Telehealth: Payer: Self-pay

## 2023-10-27 ENCOUNTER — Ambulatory Visit

## 2023-11-02 ENCOUNTER — Other Ambulatory Visit: Payer: Self-pay | Admitting: Obstetrics

## 2023-11-07 ENCOUNTER — Encounter: Payer: Self-pay | Admitting: Certified Nurse Midwife

## 2023-11-10 ENCOUNTER — Telehealth: Payer: Self-pay

## 2023-11-16 ENCOUNTER — Ambulatory Visit (INDEPENDENT_AMBULATORY_CARE_PROVIDER_SITE_OTHER): Admitting: Obstetrics

## 2023-11-16 VITALS — BP 121/75 | HR 89 | Wt 222.5 lb

## 2023-11-16 DIAGNOSIS — O99313 Alcohol use complicating pregnancy, third trimester: Secondary | ICD-10-CM | POA: Diagnosis not present

## 2023-11-16 DIAGNOSIS — O0993 Supervision of high risk pregnancy, unspecified, third trimester: Secondary | ICD-10-CM

## 2023-11-16 DIAGNOSIS — O99333 Smoking (tobacco) complicating pregnancy, third trimester: Secondary | ICD-10-CM

## 2023-11-16 DIAGNOSIS — F17210 Nicotine dependence, cigarettes, uncomplicated: Secondary | ICD-10-CM

## 2023-11-16 DIAGNOSIS — Z7984 Long term (current) use of oral hypoglycemic drugs: Secondary | ICD-10-CM

## 2023-11-16 DIAGNOSIS — Z3A28 28 weeks gestation of pregnancy: Secondary | ICD-10-CM

## 2023-11-16 DIAGNOSIS — O0990 Supervision of high risk pregnancy, unspecified, unspecified trimester: Secondary | ICD-10-CM

## 2023-11-16 DIAGNOSIS — O10013 Pre-existing essential hypertension complicating pregnancy, third trimester: Secondary | ICD-10-CM

## 2023-11-16 DIAGNOSIS — Z98891 History of uterine scar from previous surgery: Secondary | ICD-10-CM

## 2023-11-16 DIAGNOSIS — F1090 Alcohol use, unspecified, uncomplicated: Secondary | ICD-10-CM | POA: Diagnosis not present

## 2023-11-16 DIAGNOSIS — Z3480 Encounter for supervision of other normal pregnancy, unspecified trimester: Secondary | ICD-10-CM | POA: Diagnosis not present

## 2023-11-16 DIAGNOSIS — T7491XA Unspecified adult maltreatment, confirmed, initial encounter: Secondary | ICD-10-CM | POA: Insufficient documentation

## 2023-11-16 DIAGNOSIS — O34219 Maternal care for unspecified type scar from previous cesarean delivery: Secondary | ICD-10-CM

## 2023-11-16 DIAGNOSIS — O99331 Smoking (tobacco) complicating pregnancy, first trimester: Secondary | ICD-10-CM

## 2023-11-16 DIAGNOSIS — E119 Type 2 diabetes mellitus without complications: Secondary | ICD-10-CM

## 2023-11-16 DIAGNOSIS — O10919 Unspecified pre-existing hypertension complicating pregnancy, unspecified trimester: Secondary | ICD-10-CM

## 2023-11-16 MED ORDER — NICOTINE 14 MG/24HR TD PT24: 14.0000 mg | MEDICATED_PATCH | Freq: Every day | TRANSDERMAL | 0 refills | Status: AC

## 2023-11-16 NOTE — Assessment & Plan Note (Addendum)
-  Normotensive today. Taking meds as prescribed daily -Growth scan scheduled with MFM -Start NSTs at [facility]

## 2023-11-16 NOTE — Assessment & Plan Note (Addendum)
-  CBC, RPR, HIV today -Missed last MFM appt. Was able to schedule appt for 11/22/23 @ 1500.  -Reviewed kick counts and preterm labor warning signs. Instructed to call office or come to hospital with persistent headache, vision changes, regular contractions, leaking of fluid, decreased fetal movement or vaginal bleeding.

## 2023-11-16 NOTE — Assessment & Plan Note (Signed)
-  Plans repeat CS with BTL.  -Next visit with MD to schedule

## 2023-11-16 NOTE — Progress Notes (Signed)
 error

## 2023-11-16 NOTE — Assessment & Plan Note (Signed)
-  Taking metformin  daily -Did not bring blood sugar log, but reports that fasting sugars have been 89-92 and PP sugars have been 110-119. Will bring log to next visit -Encouraged healthy diet and regular exercise

## 2023-11-16 NOTE — Assessment & Plan Note (Signed)
-  Ready to quit. Rx sent for nicotine  patch
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
## 2023-11-16 NOTE — Assessment & Plan Note (Deleted)
 Reviewed kick counts and preterm labor warning signs. Instructed to call office or come to hospital with persistent headache, vision changes, regular contractions, leaking of fluid, decreased fetal movement or vaginal bleeding.

## 2023-11-16 NOTE — Assessment & Plan Note (Signed)
-  Reviewed risks of alcohol used during pregnancy and encouraged abstinence.
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
## 2023-11-17 LAB — HEP, RPR, HIV PANEL
HIV Screen 4th Generation wRfx: NONREACTIVE
Hepatitis B Surface Ag: NEGATIVE
RPR Ser Ql: NONREACTIVE

## 2023-11-17 LAB — CBC
Hematocrit: 34.8 % (ref 34.0–46.6)
Hemoglobin: 11.1 g/dL (ref 11.1–15.9)
MCH: 26.9 pg (ref 26.6–33.0)
MCHC: 31.9 g/dL (ref 31.5–35.7)
MCV: 85 fL (ref 79–97)
Platelets: 241 10*3/uL (ref 150–450)
RBC: 4.12 x10E6/uL (ref 3.77–5.28)
RDW: 14.1 % (ref 11.7–15.4)
WBC: 10.1 10*3/uL (ref 3.4–10.8)

## 2023-11-17 LAB — HEPATITIS C ANTIBODY: Hep C Virus Ab: NONREACTIVE

## 2023-11-21 ENCOUNTER — Ambulatory Visit: Payer: Self-pay | Admitting: Obstetrics

## 2023-11-22 ENCOUNTER — Ambulatory Visit: Attending: Obstetrics and Gynecology

## 2023-11-22 DIAGNOSIS — O24913 Unspecified diabetes mellitus in pregnancy, third trimester: Secondary | ICD-10-CM

## 2023-11-22 DIAGNOSIS — O0990 Supervision of high risk pregnancy, unspecified, unspecified trimester: Secondary | ICD-10-CM

## 2023-12-04 NOTE — Progress Notes (Deleted)
    Return Prenatal Note   Subjective  38 y.o. H5E8978 at [redacted]w[redacted]d presents for this follow-up prenatal visit.  Patient ***  Patient reports:   Denies vaginal bleeding or leaking fluid. Objective  Flow sheet Vitals:   Total weight gain: -14 lb 8 oz (-6.577 kg) Date Fasting Breakfast Lunch Dinner                                                   Highest /lowest fasting: 2 hour post prandial Highest/lowest Breakfast: Highest lowest Lunch: Highest/lowest Dinner:  General Appearance  No acute distress, well appearing, and well nourished Pulmonary   Normal work of breathing Neurologic   Alert and oriented to person, place, and time Psychiatric   Mood and affect within normal limits   Assessment/Plan   Plan  38 y.o. H5E8978 at [redacted]w[redacted]d presents for follow-up OB visit. Reviewed prenatal record including previous visit note. There are no diagnoses linked to this encounter.  No problem-specific Assessment & Plan notes found for this encounter.    No orders of the defined types were placed in this encounter.  No follow-ups on file.   Future Appointments  Date Time Provider Department Center  12/05/2023  3:35 PM Leigh Sober, MD [facility] None    For next visit:  {[email or web address]:[id number]}      Sober Leigh, DO [facility]

## 2023-12-05 ENCOUNTER — Encounter: Admitting: Obstetrics

## 2023-12-05 DIAGNOSIS — O24912 Unspecified diabetes mellitus in pregnancy, second trimester: Secondary | ICD-10-CM

## 2023-12-05 DIAGNOSIS — Z98891 History of uterine scar from previous surgery: Secondary | ICD-10-CM

## 2023-12-05 DIAGNOSIS — Z3480 Encounter for supervision of other normal pregnancy, unspecified trimester: Secondary | ICD-10-CM

## 2023-12-05 DIAGNOSIS — Z3A30 30 weeks gestation of pregnancy: Secondary | ICD-10-CM

## 2023-12-05 DIAGNOSIS — O10919 Unspecified pre-existing hypertension complicating pregnancy, unspecified trimester: Secondary | ICD-10-CM

## 2023-12-18 ENCOUNTER — Ambulatory Visit: Attending: Obstetrics and Gynecology

## 2023-12-18 ENCOUNTER — Ambulatory Visit

## 2023-12-19 ENCOUNTER — Other Ambulatory Visit: Payer: Self-pay | Admitting: Certified Nurse Midwife

## 2024-01-02 ENCOUNTER — Other Ambulatory Visit: Payer: Self-pay | Admitting: Certified Nurse Midwife

## 2024-01-08 ENCOUNTER — Encounter
Admission: EM | Disposition: A | Discharge status: Home / Self Care | Payer: Self-pay | Source: Home / Self Care | Attending: Obstetrics

## 2024-01-08 ENCOUNTER — Encounter: Payer: Self-pay | Admitting: Family

## 2024-01-08 ENCOUNTER — Other Ambulatory Visit: Payer: Self-pay

## 2024-01-08 ENCOUNTER — Inpatient Hospital Stay
Admission: EM | Admit: 2024-01-08 | Discharge: 2024-01-11 | DRG: 784 | Disposition: A | Discharge status: Home / Self Care | Attending: Certified Nurse Midwife | Admitting: Certified Nurse Midwife

## 2024-01-08 ENCOUNTER — Inpatient Hospital Stay: Admitting: Registered Nurse

## 2024-01-08 DIAGNOSIS — O479 False labor, unspecified: Secondary | ICD-10-CM | POA: Diagnosis present

## 2024-01-08 DIAGNOSIS — O1092 Unspecified pre-existing hypertension complicating childbirth: Secondary | ICD-10-CM | POA: Diagnosis present

## 2024-01-08 DIAGNOSIS — O99214 Obesity complicating childbirth: Secondary | ICD-10-CM | POA: Diagnosis present

## 2024-01-08 DIAGNOSIS — O2412 Pre-existing diabetes mellitus, type 2, in childbirth: Secondary | ICD-10-CM

## 2024-01-08 DIAGNOSIS — O34211 Maternal care for low transverse scar from previous cesarean delivery: Secondary | ICD-10-CM | POA: Diagnosis not present

## 2024-01-08 DIAGNOSIS — O10919 Unspecified pre-existing hypertension complicating pregnancy, unspecified trimester: Secondary | ICD-10-CM | POA: Diagnosis present

## 2024-01-08 DIAGNOSIS — O42919 Preterm premature rupture of membranes, unspecified as to length of time between rupture and onset of labor, unspecified trimester: Secondary | ICD-10-CM | POA: Diagnosis present

## 2024-01-08 DIAGNOSIS — O24919 Unspecified diabetes mellitus in pregnancy, unspecified trimester: Secondary | ICD-10-CM | POA: Diagnosis present

## 2024-01-08 DIAGNOSIS — F1990 Other psychoactive substance use, unspecified, uncomplicated: Secondary | ICD-10-CM | POA: Diagnosis present

## 2024-01-08 DIAGNOSIS — O99324 Drug use complicating childbirth: Secondary | ICD-10-CM | POA: Diagnosis present

## 2024-01-08 DIAGNOSIS — O1012 Pre-existing hypertensive heart disease complicating childbirth: Secondary | ICD-10-CM

## 2024-01-08 DIAGNOSIS — O99313 Alcohol use complicating pregnancy, third trimester: Secondary | ICD-10-CM

## 2024-01-08 DIAGNOSIS — O34219 Maternal care for unspecified type scar from previous cesarean delivery: Secondary | ICD-10-CM | POA: Diagnosis not present

## 2024-01-08 DIAGNOSIS — Z302 Encounter for sterilization: Secondary | ICD-10-CM

## 2024-01-08 DIAGNOSIS — O26893 Other specified pregnancy related conditions, third trimester: Secondary | ICD-10-CM | POA: Diagnosis present

## 2024-01-08 DIAGNOSIS — F1490 Cocaine use, unspecified, uncomplicated: Secondary | ICD-10-CM | POA: Diagnosis present

## 2024-01-08 DIAGNOSIS — Z98891 History of uterine scar from previous surgery: Secondary | ICD-10-CM

## 2024-01-08 DIAGNOSIS — T7491XA Unspecified adult maltreatment, confirmed, initial encounter: Secondary | ICD-10-CM | POA: Diagnosis present

## 2024-01-08 DIAGNOSIS — O0990 Supervision of high risk pregnancy, unspecified, unspecified trimester: Principal | ICD-10-CM

## 2024-01-08 DIAGNOSIS — O99320 Drug use complicating pregnancy, unspecified trimester: Secondary | ICD-10-CM | POA: Diagnosis present

## 2024-01-08 DIAGNOSIS — F1910 Other psychoactive substance abuse, uncomplicated: Secondary | ICD-10-CM | POA: Diagnosis not present

## 2024-01-08 DIAGNOSIS — O42013 Preterm premature rupture of membranes, onset of labor within 24 hours of rupture, third trimester: Secondary | ICD-10-CM

## 2024-01-08 DIAGNOSIS — Z6841 Body Mass Index (BMI) 40.0 and over, adult: Secondary | ICD-10-CM

## 2024-01-08 DIAGNOSIS — E119 Type 2 diabetes mellitus without complications: Secondary | ICD-10-CM

## 2024-01-08 DIAGNOSIS — O99334 Smoking (tobacco) complicating childbirth: Secondary | ICD-10-CM | POA: Diagnosis not present

## 2024-01-08 DIAGNOSIS — O42913 Preterm premature rupture of membranes, unspecified as to length of time between rupture and onset of labor, third trimester: Secondary | ICD-10-CM | POA: Diagnosis present

## 2024-01-08 DIAGNOSIS — F17210 Nicotine dependence, cigarettes, uncomplicated: Secondary | ICD-10-CM

## 2024-01-08 DIAGNOSIS — O24425 Gestational diabetes mellitus in childbirth, controlled by oral hypoglycemic drugs: Secondary | ICD-10-CM | POA: Diagnosis present

## 2024-01-08 DIAGNOSIS — O24913 Unspecified diabetes mellitus in pregnancy, third trimester: Secondary | ICD-10-CM

## 2024-01-08 DIAGNOSIS — Z3A35 35 weeks gestation of pregnancy: Secondary | ICD-10-CM

## 2024-01-08 LAB — COMPREHENSIVE METABOLIC PANEL WITH GFR
ALT: 16 U/L (ref 0–44)
AST: 20 U/L (ref 15–41)
Albumin: 2.3 g/dL — ABNORMAL LOW (ref 3.5–5.0)
Alkaline Phosphatase: 130 U/L — ABNORMAL HIGH (ref 38–126)
Anion gap: 10 (ref 5–15)
BUN: 9 mg/dL (ref 6–20)
CO2: 21 mmol/L — ABNORMAL LOW (ref 22–32)
Calcium: 8.3 mg/dL — ABNORMAL LOW (ref 8.9–10.3)
Chloride: 107 mmol/L (ref 98–111)
Creatinine, Ser: 0.4 mg/dL — ABNORMAL LOW (ref 0.44–1.00)
GFR, Estimated: >60 mL/min (ref >60)
Glucose, Bld: 146 mg/dL — ABNORMAL HIGH (ref 70–99)
Potassium: 4.3 mmol/L (ref 3.5–5.1)
Sodium: 138 mmol/L (ref 135–145)
Total Bilirubin: 0.6 mg/dL (ref 0.0–1.2)
Total Protein: 6.1 g/dL — ABNORMAL LOW (ref 6.5–8.1)

## 2024-01-08 LAB — URINE DRUG SCREEN, QUALITATIVE (ARMC ONLY)
Amphetamines, Ur Screen: NOT DETECTED
Barbiturates, Ur Screen: NOT DETECTED
Benzodiazepine, Ur Scrn: NOT DETECTED
Cannabinoid 50 Ng, Ur ~~LOC~~: NOT DETECTED
Cocaine Metabolite,Ur ~~LOC~~: POSITIVE — AB
MDMA (Ecstasy)Ur Screen: NOT DETECTED
Methadone Scn, Ur: NOT DETECTED
Opiate, Ur Screen: NOT DETECTED
Phencyclidine (PCP) Ur S: NOT DETECTED
Tricyclic, Ur Screen: NOT DETECTED

## 2024-01-08 LAB — GROUP B STREP BY PCR: Group B strep by PCR: NEGATIVE

## 2024-01-08 LAB — TYPE AND SCREEN
ABO/RH(D): A POS
Antibody Screen: NEGATIVE

## 2024-01-08 LAB — PROTEIN / CREATININE RATIO, URINE
Creatinine, Urine: 116 mg/dL
Creatinine, Urine: 38 mg/dL
Protein Creatinine Ratio: 1.26 mg/mg{creat} — ABNORMAL HIGH (ref 0.00–0.15)
Protein Creatinine Ratio: 0.39 mg/mg{creat} — ABNORMAL HIGH (ref 0.00–0.15)
Total Protein, Urine: 146 mg/dL
Total Protein, Urine: 15 mg/dL

## 2024-01-08 LAB — CBC
HCT: 34.8 % — ABNORMAL LOW (ref 36.0–46.0)
Hemoglobin: 10.8 g/dL — ABNORMAL LOW (ref 12.0–15.0)
MCH: 24.6 pg — ABNORMAL LOW (ref 26.0–34.0)
MCHC: 31 g/dL (ref 30.0–36.0)
MCV: 79.3 fL — ABNORMAL LOW (ref 80.0–100.0)
Platelets: 251 K/uL (ref 150–400)
RBC: 4.39 MIL/uL (ref 3.87–5.11)
RDW: 14.6 % (ref 11.5–15.5)
WBC: 10.1 K/uL (ref 4.0–10.5)
nRBC: 0 % (ref 0.0–0.2)

## 2024-01-08 LAB — GLUCOSE, CAPILLARY: Glucose-Capillary: 113 mg/dL — ABNORMAL HIGH (ref 70–99)

## 2024-01-08 SURGERY — Surgical Case
Anesthesia: Spinal

## 2024-01-08 MED ORDER — KETOROLAC TROMETHAMINE 30 MG/ML IJ SOLN
INTRAMUSCULAR | Status: DC | PRN
  Administered 2024-01-08 (×2): 30 mg via INTRAVENOUS

## 2024-01-08 MED ORDER — DROPERIDOL 2.5 MG/ML IJ SOLN: 0.6250 mg | Freq: Once | INTRAMUSCULAR | Status: DC | PRN

## 2024-01-08 MED ORDER — MORPHINE SULFATE (PF) 0.5 MG/ML IJ SOLN
INTRAMUSCULAR | Status: AC
  Filled 2024-01-08: qty 10

## 2024-01-08 MED ORDER — PENICILLIN G POT IN DEXTROSE 60000 UNIT/ML IV SOLN: 3.0000 10*6.[IU] | INTRAVENOUS | Status: DC

## 2024-01-08 MED ORDER — LIDOCAINE 5 % EX PTCH
MEDICATED_PATCH | CUTANEOUS | Status: DC | PRN
  Administered 2024-01-08 (×2): 1 via TRANSDERMAL

## 2024-01-08 MED ORDER — OXYCODONE HCL 5 MG PO TABS
5.0000 mg | ORAL_TABLET | ORAL | Status: DC | PRN
  Administered 2024-01-09 (×8): 5 mg via ORAL
  Administered 2024-01-10 – 2024-01-11 (×10): 10 mg via ORAL
  Filled 2024-01-08 (×5): qty 2
  Filled 2024-01-08 (×3): qty 1
  Filled 2024-01-08: qty 2
  Filled 2024-01-08: qty 1
  Filled 2024-01-08: qty 2

## 2024-01-08 MED ORDER — SODIUM CHLORIDE 0.9 % IV SOLN
5.0000 10*6.[IU] | Freq: Once | INTRAVENOUS | Status: AC
  Administered 2024-01-08 (×2): 5 10*6.[IU] via INTRAVENOUS
  Filled 2024-01-08: qty 5

## 2024-01-08 MED ORDER — SERTRALINE HCL 25 MG PO TABS
50.0000 mg | ORAL_TABLET | Freq: Every day | ORAL | Status: DC
  Administered 2024-01-09 – 2024-01-11 (×5): 50 mg via ORAL
  Filled 2024-01-08 (×2): qty 2
  Filled 2024-01-08: qty 1
  Filled 2024-01-08: qty 2

## 2024-01-08 MED ORDER — OXYTOCIN-SODIUM CHLORIDE 30-0.9 UT/500ML-% IV SOLN
2.5000 [IU]/h | INTRAVENOUS | Status: AC
  Administered 2024-01-08 (×2): 2.5 [IU]/h via INTRAVENOUS
  Filled 2024-01-08: qty 500

## 2024-01-08 MED ORDER — FAMOTIDINE 20 MG PO TABS
20.0000 mg | ORAL_TABLET | Freq: Two times a day (BID) | ORAL | Status: DC
  Administered 2024-01-08 – 2024-01-11 (×11): 20 mg via ORAL
  Filled 2024-01-08 (×6): qty 1

## 2024-01-08 MED ORDER — NIFEDIPINE ER OSMOTIC RELEASE 30 MG PO TB24: 60.0000 mg | ORAL_TABLET | Freq: Every day | ORAL | Status: DC

## 2024-01-08 MED ORDER — LABETALOL HCL 200 MG PO TABS
200.0000 mg | ORAL_TABLET | Freq: Two times a day (BID) | ORAL | Status: DC
  Administered 2024-01-08 (×2): 200 mg via ORAL
  Filled 2024-01-08: qty 2

## 2024-01-08 MED ORDER — IBUPROFEN 800 MG PO TABS: 800.0000 mg | ORAL_TABLET | Freq: Three times a day (TID) | ORAL | Status: DC

## 2024-01-08 MED ORDER — FENTANYL CITRATE (PF) 100 MCG/2ML IJ SOLN: 25.0000 ug | INTRAMUSCULAR | Status: DC | PRN

## 2024-01-08 MED ORDER — ONDANSETRON HCL 4 MG/2ML IJ SOLN
INTRAMUSCULAR | Status: DC | PRN
  Administered 2024-01-08 (×2): 4 mg via INTRAVENOUS

## 2024-01-08 MED ORDER — OXYCODONE HCL 5 MG/5ML PO SOLN: 5.0000 mg | Freq: Once | ORAL | Status: DC | PRN

## 2024-01-08 MED ORDER — ONDANSETRON HCL 4 MG/2ML IJ SOLN: 4.0000 mg | Freq: Four times a day (QID) | INTRAMUSCULAR | Status: DC | PRN

## 2024-01-08 MED ORDER — NIFEDIPINE ER OSMOTIC RELEASE 30 MG PO TB24
60.0000 mg | ORAL_TABLET | Freq: Every day | ORAL | Status: DC
  Administered 2024-01-09 (×2): 60 mg via ORAL
  Filled 2024-01-08: qty 2

## 2024-01-08 MED ORDER — SODIUM CHLORIDE 0.9 % IV SOLN
500.0000 mg | INTRAVENOUS | Status: AC
  Administered 2024-01-08 (×2): 500 mg via INTRAVENOUS

## 2024-01-08 MED ORDER — LABETALOL HCL 100 MG PO TABS: 200.0000 mg | ORAL_TABLET | Freq: Two times a day (BID) | ORAL | Status: DC

## 2024-01-08 MED ORDER — BETAMETHASONE SOD PHOS & ACET 6 (3-3) MG/ML IJ SUSP
12.0000 mg | Freq: Once | INTRAMUSCULAR | Status: AC
  Administered 2024-01-08 (×2): 12 mg via INTRAMUSCULAR
  Filled 2024-01-08: qty 5

## 2024-01-08 MED ORDER — CEFAZOLIN SODIUM-DEXTROSE 2-4 GM/100ML-% IV SOLN
2.0000 g | INTRAVENOUS | Status: AC
  Administered 2024-01-08 (×2): 2 g via INTRAVENOUS

## 2024-01-08 MED ORDER — LABETALOL HCL 5 MG/ML IV SOLN: 40.0000 mg | INTRAVENOUS | Status: DC | PRN

## 2024-01-08 MED ORDER — OXYTOCIN BOLUS FROM INFUSION: 333.0000 mL | Freq: Once | INTRAVENOUS | Status: DC

## 2024-01-08 MED ORDER — BUPIVACAINE IN DEXTROSE 0.75-8.25 % IT SOLN
INTRATHECAL | Status: DC | PRN
  Administered 2024-01-08 (×2): 1.4 mL via INTRATHECAL

## 2024-01-08 MED ORDER — KETOROLAC TROMETHAMINE 30 MG/ML IJ SOLN
30.0000 mg | Freq: Four times a day (QID) | INTRAMUSCULAR | Status: DC
  Administered 2024-01-09 (×2): 30 mg via INTRAVENOUS
  Filled 2024-01-08: qty 1

## 2024-01-08 MED ORDER — PHENYLEPHRINE HCL-NACL 20-0.9 MG/250ML-% IV SOLN
INTRAVENOUS | Status: DC | PRN
  Administered 2024-01-08 (×2): 50 ug/min via INTRAVENOUS

## 2024-01-08 MED ORDER — FENTANYL CITRATE (PF) 100 MCG/2ML IJ SOLN
INTRAMUSCULAR | Status: AC
  Filled 2024-01-08: qty 2

## 2024-01-08 MED ORDER — LABETALOL HCL 5 MG/ML IV SOLN: 80.0000 mg | INTRAVENOUS | Status: DC | PRN

## 2024-01-08 MED ORDER — LABETALOL HCL 5 MG/ML IV SOLN
20.0000 mg | INTRAVENOUS | Status: DC | PRN
  Administered 2024-01-08 (×2): 20 mg via INTRAVENOUS
  Filled 2024-01-08: qty 4

## 2024-01-08 MED ORDER — FENTANYL CITRATE (PF) 100 MCG/2ML IJ SOLN
INTRAMUSCULAR | Status: DC | PRN
  Administered 2024-01-08 (×2): 15 ug via INTRATHECAL

## 2024-01-08 MED ORDER — SOD CITRATE-CITRIC ACID 500-334 MG/5ML PO SOLN
30.0000 mL | ORAL | Status: AC
  Administered 2024-01-08 (×2): 30 mL via ORAL

## 2024-01-08 MED ORDER — MORPHINE SULFATE (PF) 0.5 MG/ML IJ SOLN
INTRAMUSCULAR | Status: DC | PRN
  Administered 2024-01-08 (×2): .1 mg via INTRATHECAL

## 2024-01-08 MED ORDER — TRANEXAMIC ACID-NACL 1000-0.7 MG/100ML-% IV SOLN
INTRAVENOUS | Status: AC
  Filled 2024-01-08: qty 100

## 2024-01-08 MED ORDER — HYDRALAZINE HCL 20 MG/ML IJ SOLN: 10.0000 mg | INTRAMUSCULAR | Status: DC | PRN

## 2024-01-08 MED ORDER — LACTATED RINGERS IV SOLN: INTRAVENOUS | Status: DC

## 2024-01-08 MED ORDER — ENOXAPARIN SODIUM 60 MG/0.6ML IJ SOSY
0.5000 mg/kg | PREFILLED_SYRINGE | INTRAMUSCULAR | Status: DC
  Filled 2024-01-08 (×3): qty 0.6

## 2024-01-08 MED ORDER — PHENYLEPHRINE 80 MCG/ML (10ML) SYRINGE FOR IV PUSH (FOR BLOOD PRESSURE SUPPORT)
PREFILLED_SYRINGE | INTRAVENOUS | Status: DC | PRN
  Administered 2024-01-08: 80 ug via INTRAVENOUS
  Administered 2024-01-08 (×2): 10 ug via INTRAVENOUS
  Administered 2024-01-08: 80 ug via INTRAVENOUS

## 2024-01-08 MED ORDER — TRANEXAMIC ACID-NACL 1000-0.7 MG/100ML-% IV SOLN
INTRAVENOUS | Status: DC | PRN
  Administered 2024-01-08 (×2): 1000 mg via INTRAVENOUS

## 2024-01-08 MED ORDER — SOD CITRATE-CITRIC ACID 500-334 MG/5ML PO SOLN: 30.0000 mL | ORAL | Status: DC | PRN

## 2024-01-08 MED ORDER — OXYTOCIN-SODIUM CHLORIDE 30-0.9 UT/500ML-% IV SOLN
2.5000 [IU]/h | INTRAVENOUS | Status: DC
  Administered 2024-01-08 (×2): 30 [IU] via INTRAVENOUS
  Filled 2024-01-08: qty 1000

## 2024-01-08 MED ORDER — LIDOCAINE HCL (PF) 1 % IJ SOLN: 30.0000 mL | INTRAMUSCULAR | Status: DC | PRN

## 2024-01-08 MED ORDER — LACTATED RINGERS IV SOLN: 500.0000 mL | INTRAVENOUS | Status: DC | PRN

## 2024-01-08 MED ORDER — KETOROLAC TROMETHAMINE 30 MG/ML IJ SOLN
INTRAMUSCULAR | Status: AC
  Filled 2024-01-08: qty 1

## 2024-01-08 MED ORDER — OXYCODONE HCL 5 MG PO TABS: 5.0000 mg | ORAL_TABLET | Freq: Once | ORAL | Status: DC | PRN

## 2024-01-08 MED ORDER — ACETAMINOPHEN 10 MG/ML IV SOLN: 1000.0000 mg | Freq: Once | INTRAVENOUS | Status: DC | PRN

## 2024-01-08 SURGICAL SUPPLY — 29 items
BAG COUNTER SPONGE SURGICOUNT (BAG) ×1 IMPLANT
BENZOIN TINCTURE PRP APPL 2/3 (GAUZE/BANDAGES/DRESSINGS) IMPLANT
CHLORAPREP W/TINT 26 (MISCELLANEOUS) ×2 IMPLANT
CLOSURE STERI-STRIP 1/4X4 (GAUZE/BANDAGES/DRESSINGS) IMPLANT
DERMABOND ADVANCED .7 DNX12 (GAUZE/BANDAGES/DRESSINGS) IMPLANT
DRESSING PEEL AND PLAC PRVNA20 (GAUZE/BANDAGES/DRESSINGS) IMPLANT
DRSG TELFA 3X8 NADH STRL (GAUZE/BANDAGES/DRESSINGS) ×1 IMPLANT
ELECTRODE REM PT RTRN 9FT ADLT (ELECTROSURGICAL) ×1 IMPLANT
EXTRACTOR VACUUM KIWI (MISCELLANEOUS) IMPLANT
GAUZE SPONGE 4X4 12PLY STRL (GAUZE/BANDAGES/DRESSINGS) ×1 IMPLANT
GLOVE BIO SURGEON STRL SZ 6 (GLOVE) ×1 IMPLANT
GLOVE BIOGEL PI IND STRL 6 (GLOVE) ×1 IMPLANT
GOWN STRL REUS W/ TWL LRG LVL3 (GOWN DISPOSABLE) ×2 IMPLANT
KIT TURNOVER KIT A (KITS) ×1 IMPLANT
MANIFOLD NEPTUNE II (INSTRUMENTS) ×1 IMPLANT
MAT PREVALON FULL STRYKER (MISCELLANEOUS) ×1 IMPLANT
NS IRRIG 1000ML POUR BTL (IV SOLUTION) ×1 IMPLANT
PACK C SECTION AR (MISCELLANEOUS) ×1 IMPLANT
PAD OB MATERNITY 11 LF (PERSONAL CARE ITEMS) ×1 IMPLANT
PAD PREP OB/GYN DISP 24X41 (PERSONAL CARE ITEMS) ×1 IMPLANT
RETRACTOR TRAXI PANNICULUS (MISCELLANEOUS) IMPLANT
SCRUB CHG 4% DYNA-HEX 4OZ (MISCELLANEOUS) ×1 IMPLANT
SUT MNCRL AB 4-0 PS2 18 (SUTURE) ×1 IMPLANT
SUT MON AB 3-0 SH 27 (SUTURE) IMPLANT
SUT VIC AB 0 CT1 36 (SUTURE) ×4 IMPLANT
SUT VIC AB 0 CTX36XBRD ANBCTRL (SUTURE) IMPLANT
SUT VIC AB 3-0 SH 27X BRD (SUTURE) ×1 IMPLANT
TRAP FLUID SMOKE EVACUATOR (MISCELLANEOUS) ×1 IMPLANT
WATER STERILE IRR 500ML POUR (IV SOLUTION) ×1 IMPLANT
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
## 2024-01-08 NOTE — Anesthesia Preprocedure Evaluation (Signed)
 Anesthesia Evaluation  Patient identified by MRN, date of birth, ID band Patient awake    Reviewed: Allergy & Precautions, H&P , NPO status , Patient's Chart, lab work & pertinent test results, reviewed documented beta blocker date and time   Airway Mallampati: II  TM Distance: >3 FB Neck ROM: full    Dental no notable dental hx. (+) Teeth Intact   Pulmonary neg pulmonary ROS, Current Smoker   Pulmonary exam normal breath sounds clear to auscultation       Cardiovascular Exercise Tolerance: Good hypertension, On Medications negative cardio ROS  Rhythm:regular Rate:Normal     Neuro/Psych  PSYCHIATRIC DISORDERS  Depression    negative neurological ROS     GI/Hepatic negative GI ROS, Neg liver ROS,,,  Endo/Other  negative endocrine ROSdiabetes, Well Controlled, Gestational    Renal/GU negative Renal ROS  negative genitourinary   Musculoskeletal   Abdominal   Peds  Hematology  (+) Blood dyscrasia, anemia   Anesthesia Other Findings   Reproductive/Obstetrics (+) Pregnancy                              Anesthesia Physical Anesthesia Plan  ASA: 2 and emergent  Anesthesia Plan: Epidural   Post-op Pain Management:    Induction:   PONV Risk Score and Plan: 2  Airway Management Planned:   Additional Equipment:   Intra-op Plan:   Post-operative Plan:   Informed Consent: I have reviewed the patients History and Physical, chart, labs and discussed the procedure including the risks, benefits and alternatives for the proposed anesthesia with the patient or authorized representative who has indicated his/her understanding and acceptance.       Plan Discussed with:   Anesthesia Plan Comments:         Anesthesia Quick Evaluation

## 2024-01-08 NOTE — Op Note (Signed)
 CESAREAN SECTION OPERATIVE REPORT   DATE OF SURGERY: 01/08/24  SURGEON: Estil Mangle, DO ASSISTANT:  Lolita Cowen,CNM ANESTHESIA: Spinal  PROCEDURE: Repeat low transverse cesarean section Bilateral tubal ligation, [place] method  PREOPERATIVE DIAGNOSES: 1. Intrauterine pregnancy at [redacted]w[redacted]d 2. History of previous cesarean section x 1 3. PPROM 4. Uterine contractions 5. Chronic hypertension 6. T2DM 7. Substance use in pregnancy 8. Tobacco use disorder 9. BMI 43.5 10. Desires sterilization  POSTOPERATIVE DIAGNOSES: 1. Same, s/p rLTCS w/BTL 2. Vacuum assisted delivery  QBL:  975cc DRAINS: foley catheter to gravity drainage, 200 ml of clear urine at end of the procedure TOTAL IV FLUIDS: SPECIMENS: bilateral tube segments COMPLICATIONS:  None  FINDINGS:  Viable female infant in cephalic presentation; APGARs 6/9; weight 3040 grams (6lbs, 11oz) Clear fluid at amniotomy Intact placenta with 3 vessel cord Uterus, tubes, and ovaries appeared normal  INDICATION and CONSENT: Olivia Alexander is a 38 y.o. H5E8978 with IUP at [redacted]w[redacted]d presenting repeat cesarean in setting of confirmed PPROM and latent labor, desiring repeat and sterilization. The patient understood that the risks of cesarean section include, but are not limited to, visceral or vascular injury, infection, blood loss and need for transfusion, prolonged hospitalization, and reoperation.  The patient stated understanding and desired to proceed.  All questions were answered.  PROCEDURE:  After verbal and written informed consent was obtained, the patient was taken to the operating room where spinal anesthesia was found to be adequate.  SCDs were applied to the lower extremities and a Foley catheter was placed in the bladder under sterile technique.  The patient was placed in dorsal supine position with a leftward tilt, prepped and draped in a sterile fashion.  Two grams of Cefazolin  and 500mg  of Azithromycin  were given for  infection prophylaxis.  Level of anesthesia was confirmed to be adequate with Allis clamps.  A Pfannenstiel skin incision was made with the scalpel and carried down to the underlying layer of rectus fascia.  The fascia was nicked bilaterally in the midline with the scalpel and the fascial incision was extended laterally using Mayo scissors.  The superior aspect of the fascia was grasped with Kocher clamps and the underlying rectus muscles were dissected off sharply with Mayo scissors and bluntly.  In a similar fashion, the inferior aspect of fascia was grasped with Kocher clamps and the underlying rectus and pyramidalis were dissected off sharply and bluntly.  The rectus muscles were separated in the midline bluntly.  The peritoneum was found to be free of adherent bowel and entered bluntly.  The peritoneal incision was extended bluntly to the bladder reflection with good visualization of the bladder.  The Alexis retractor was inserted and vesicouterine peritoneum identified.  Intraabdomnial survey revealed scant, clear peritoneal fluid and a thinned-out lower uterine segment.  The lower uterine segment was incised transversely with the scalpel.  The amniotic sac was ruptured with an Allis clamp and clear fluid noted.  The uterine incision was extended bluntly in a cranial-caudal fashion.  The fetus was in cephalic presentation.  The head was flexed and elevated to the level of the uterine incision.  Gentle fundal pressure was applied by the assistant and infant not near hysterotomy, Kiwi vacuum applied and pumped to and with 1 pull, the infant was delivered without difficulty.  The nose and mouth were suctioned with a bulb. The cord was doubly clamped and cut, with a cord segment obtained.  The infant was handed to the awaiting NICU team.  The  placenta delivered intact & spontaneously with manual massage of the uterine fundus. The uterus was left in situ.  The inside of the uterus was gently wiped with  lap sponges x 2 ensuring complete removal of placental membranes. There uterus was very boggy and bimanual massage was performed, administered TXA.  The uterine incision was repaired with a double layer closure of 0-Vicryl first in a locking fashion, followed by 0-Vicryl in an imbricating stitch, with excellent hemostasis achieved.  The ovaries and tubes were found to be grossly normal.    Attention was then turned to the fallopian tubes. The left fallopian tube was identified and grasped with a Babcock clamp.  The tube was followed out to the fimbriae. A window was created in the mesosalpinx. The distal and proximal ends of the tube were individually ligated with 3-0 Plain suture. The tube was excised and sent to pathology for confirmation. Good hemostasis on both ends of the tubal stumps were noted. The peritoneal edge of the broad ligament was noted to be hemostatic. The tube was returned to the abdominal cavity.  The right fallopian tube was ligated in a similar fashion.  Good hemostasis was again noted.   Blood clots, debris and fluid were cleaned from the abdomen, gutters, and pelvis with moist laparotomy sponges.  The uterine incision was reinspected and was hemostatic, as were the tubal sites.  The superior and inferior fascia were grasped with Kocher clamps and the rectus muscles were examined and found to be hemostatic, ensured with Bovie electrocautery.  The peritoneum was closed with 3-0 Monocryl in a continuous, running fashion. The fascial layer was closed with 0-Vicryl in a running fashion.  The subcutaneous tissue was irrigated, made hemostatic with Bovie electrocautery, then reapproximated with a running layer of 3-0 Plain. The skin was closed subcuticularly with 4-0 Vicryl on a keith and steristrips and a sterile Prevena wound vac was placed.  All sponge, lap and instrument counts were correct x 2. The patient tolerated the procedure well and was taken to the recovery room in stable  condition.  An experienced assistant was required given the standard of surgical care given the complexity of the case.  This assistant was needed for exposure, dissection, suctioning, retraction, instrument exchange, and for overall help during the procedure.   Estil Mangle, DO [facility]

## 2024-01-08 NOTE — Transfer of Care (Signed)
 Immediate Anesthesia Transfer of Care Note  Patient: Olivia Alexander  Procedure(s) Performed: CESAREAN DELIVERY  Patient Location: PACU and Mother/Baby  Anesthesia Type:Spinal  Level of Consciousness: awake, alert , and oriented  Airway & Oxygen Therapy: Patient Spontanous Breathing  Post-op Assessment: Report given to RN and Post -op Vital signs reviewed and stable  Post vital signs: Reviewed and stable  Last Vitals:  Vitals Value Taken Time  BP 130/92 01/08/24 21:33  Temp    Pulse 98 01/08/24 21:33  Resp 18 01/08/24 21:33  SpO2      Last Pain:  Vitals:   01/08/24 1625  TempSrc: Oral  PainSc:       Patients Stated Pain Goal: 0 (01/08/24 1302)  Complications: No notable events documented.

## 2024-01-08 NOTE — Progress Notes (Signed)
 Anticoagulation monitoring(Lovenox ):  38 yo female ordered Lovenox  40 mg Q24h    Filed Weights   01/08/24 1155  Weight: 104.3 kg (230 lb)   BMI 43    Lab Results  Component Value Date   CREATININE 0.40 (L) 01/08/2024   CREATININE 0.45 (L) 09/20/2023   CREATININE 0.47 (L) 07/24/2023   Estimated Creatinine Clearance: 104.9 mL/min (A) (by C-G formula based on SCr of 0.4 mg/dL (L)). Hemoglobin & Hematocrit     Component Value Date/Time   HGB 10.8 (L) 01/08/2024 1232   HGB 11.1 11/16/2023 1427   HCT 34.8 (L) 01/08/2024 1232   HCT 34.8 11/16/2023 1427     Per Protocol for Patient with estCrcl > 30 ml/min and BMI > 30, will transition to Lovenox  52.5 mg Q24h.

## 2024-01-08 NOTE — Anesthesia Procedure Notes (Signed)
 Spinal  Patient location during procedure: OR Start time: 01/08/2024 7:55 PM End time: 01/08/2024 7:57 PM Reason for block: surgical anesthesia Staffing Performed: resident/CRNA  Anesthesiologist: Myra Lynwood MATSU, MD Resident/CRNA: Erie Jyl MATSU, CRNA Performed by: Erie Jyl MATSU, CRNA Authorized by: Myra Lynwood MATSU, MD   Preanesthetic Checklist Completed: patient identified, IV checked, site marked, risks and benefits discussed, surgical consent, monitors and equipment checked, pre-op evaluation and timeout performed Spinal Block Patient position: sitting Prep: ChloraPrep Patient monitoring: heart rate, continuous pulse ox, blood pressure and cardiac monitor Approach: midline Location: L3-4 Injection technique: single-shot Needle Needle type: Whitacre and Introducer  Needle gauge: 24 G Needle length: 9 cm Assessment Sensory level: T4 Events: CSF return Additional Notes Sterile aseptic technique used throughout the procedure.  Negative paresthesia. Negative blood return. Positive free-flowing CSF. Expiration date of kit checked and confirmed. Patient tolerated procedure well, without complications.

## 2024-01-09 ENCOUNTER — Encounter: Payer: Self-pay | Admitting: Family

## 2024-01-09 LAB — CBC
HCT: 28.1 % — ABNORMAL LOW (ref 36.0–46.0)
Hemoglobin: 8.9 g/dL — ABNORMAL LOW (ref 12.0–15.0)
MCH: 25.4 pg — ABNORMAL LOW (ref 26.0–34.0)
MCHC: 31.7 g/dL (ref 30.0–36.0)
MCV: 80.1 fL (ref 80.0–100.0)
Platelets: 202 K/uL (ref 150–400)
RBC: 3.51 MIL/uL — ABNORMAL LOW (ref 3.87–5.11)
RDW: 14.5 % (ref 11.5–15.5)
WBC: 16.4 K/uL — ABNORMAL HIGH (ref 4.0–10.5)
nRBC: 0 % (ref 0.0–0.2)

## 2024-01-09 LAB — GLUCOSE, CAPILLARY
Glucose-Capillary: 139 mg/dL — ABNORMAL HIGH (ref 70–99)
Glucose-Capillary: 161 mg/dL — ABNORMAL HIGH (ref 70–99)

## 2024-01-09 LAB — RPR: RPR Ser Ql: NONREACTIVE

## 2024-01-09 MED ORDER — NALOXONE HCL 0.4 MG/ML IJ SOLN: 0.4000 mg | INTRAMUSCULAR | Status: DC | PRN

## 2024-01-09 MED ORDER — ONDANSETRON HCL 4 MG/2ML IJ SOLN: 4.0000 mg | Freq: Three times a day (TID) | INTRAMUSCULAR | Status: DC | PRN

## 2024-01-09 MED ORDER — KETOROLAC TROMETHAMINE 30 MG/ML IJ SOLN: 30.0000 mg | Freq: Four times a day (QID) | INTRAMUSCULAR | Status: AC | PRN

## 2024-01-09 MED ORDER — ACETAMINOPHEN 500 MG PO TABS
1000.0000 mg | ORAL_TABLET | Freq: Four times a day (QID) | ORAL | Status: DC
  Administered 2024-01-09 – 2024-01-11 (×16): 1000 mg via ORAL
  Filled 2024-01-09 (×9): qty 2

## 2024-01-09 MED ORDER — KETOROLAC TROMETHAMINE 30 MG/ML IJ SOLN
30.0000 mg | Freq: Four times a day (QID) | INTRAMUSCULAR | Status: DC
  Administered 2024-01-09 (×4): 30 mg via INTRAVENOUS
  Filled 2024-01-09 (×2): qty 1

## 2024-01-09 MED ORDER — GABAPENTIN 100 MG PO CAPS
200.0000 mg | ORAL_CAPSULE | Freq: Three times a day (TID) | ORAL | Status: DC
  Administered 2024-01-09 – 2024-01-11 (×13): 200 mg via ORAL
  Filled 2024-01-09 (×7): qty 2

## 2024-01-09 MED ORDER — IBUPROFEN 800 MG PO TABS
800.0000 mg | ORAL_TABLET | Freq: Three times a day (TID) | ORAL | Status: DC
  Administered 2024-01-09 – 2024-01-11 (×8): 800 mg via ORAL
  Filled 2024-01-09 (×5): qty 1

## 2024-01-09 MED ORDER — SIMETHICONE 80 MG PO CHEW
80.0000 mg | CHEWABLE_TABLET | Freq: Three times a day (TID) | ORAL | Status: DC
  Administered 2024-01-09 – 2024-01-11 (×12): 80 mg via ORAL
  Filled 2024-01-09 (×7): qty 1

## 2024-01-09 MED ORDER — LABETALOL HCL 200 MG PO TABS
300.0000 mg | ORAL_TABLET | Freq: Two times a day (BID) | ORAL | Status: DC
  Administered 2024-01-09 – 2024-01-10 (×4): 300 mg via ORAL
  Filled 2024-01-09 (×2): qty 1

## 2024-01-09 MED ORDER — NALOXONE HCL 4 MG/10ML IJ SOLN: 1.0000 ug/kg/h | INTRAVENOUS | Status: DC | PRN

## 2024-01-09 MED ORDER — NICOTINE 14 MG/24HR TD PT24
14.0000 mg | MEDICATED_PATCH | Freq: Every day | TRANSDERMAL | Status: DC
  Filled 2024-01-09 (×2): qty 1

## 2024-01-09 MED ORDER — ACETAMINOPHEN 500 MG PO TABS
1000.0000 mg | ORAL_TABLET | Freq: Four times a day (QID) | ORAL | Status: DC
Start: 2024-01-09 — End: 2024-01-09

## 2024-01-09 MED ORDER — SODIUM CHLORIDE 0.9% FLUSH: 3.0000 mL | INTRAVENOUS | Status: DC | PRN

## 2024-01-09 MED ORDER — PRENATAL MULTIVITAMIN CH
1.0000 | ORAL_TABLET | Freq: Every day | ORAL | Status: DC
  Administered 2024-01-09 – 2024-01-11 (×5): 1 via ORAL
  Filled 2024-01-09 (×3): qty 1

## 2024-01-09 MED ORDER — FUROSEMIDE 20 MG PO TABS
20.0000 mg | ORAL_TABLET | Freq: Every day | ORAL | Status: DC
  Administered 2024-01-09 – 2024-01-11 (×5): 20 mg via ORAL
  Filled 2024-01-09 (×3): qty 1

## 2024-01-09 MED ORDER — SENNOSIDES-DOCUSATE SODIUM 8.6-50 MG PO TABS
2.0000 | ORAL_TABLET | Freq: Every day | ORAL | Status: DC
  Administered 2024-01-09 – 2024-01-11 (×5): 2 via ORAL
  Filled 2024-01-09 (×3): qty 2

## 2024-01-09 MED ORDER — KETOROLAC TROMETHAMINE 30 MG/ML IJ SOLN
30.0000 mg | Freq: Four times a day (QID) | INTRAMUSCULAR | Status: AC | PRN
Start: 2024-01-09 — End: 2024-01-10

## 2024-01-09 MED ORDER — DIPHENHYDRAMINE HCL 50 MG/ML IJ SOLN: 12.5000 mg | INTRAMUSCULAR | Status: DC | PRN

## 2024-01-09 MED ORDER — ACETAMINOPHEN 500 MG PO TABS
1000.0000 mg | ORAL_TABLET | Freq: Four times a day (QID) | ORAL | Status: DC
  Administered 2024-01-09 (×2): 1000 mg via ORAL
  Filled 2024-01-09: qty 2

## 2024-01-09 MED ORDER — FERROUS SULFATE 325 (65 FE) MG PO TABS
325.0000 mg | ORAL_TABLET | Freq: Every day | ORAL | Status: DC
  Administered 2024-01-09 – 2024-01-11 (×5): 325 mg via ORAL
  Filled 2024-01-09 (×3): qty 1

## 2024-01-09 MED ORDER — WITCH HAZEL-GLYCERIN EX PADS: 1.0000 | MEDICATED_PAD | CUTANEOUS | Status: DC | PRN

## 2024-01-09 MED ORDER — LACTATED RINGERS IV SOLN: INTRAVENOUS | Status: AC

## 2024-01-09 MED ORDER — ALBUTEROL SULFATE (2.5 MG/3ML) 0.083% IN NEBU: 2.5000 mg | INHALATION_SOLUTION | Freq: Four times a day (QID) | RESPIRATORY_TRACT | Status: DC | PRN

## 2024-01-09 MED ORDER — DIPHENHYDRAMINE HCL 25 MG PO CAPS: 25.0000 mg | ORAL_CAPSULE | Freq: Four times a day (QID) | ORAL | Status: DC | PRN

## 2024-01-09 MED ORDER — MENTHOL 3 MG MT LOZG: 1.0000 | LOZENGE | OROMUCOSAL | Status: DC | PRN

## 2024-01-09 MED ORDER — IBUPROFEN 800 MG PO TABS: 800.0000 mg | ORAL_TABLET | Freq: Three times a day (TID) | ORAL | Status: DC

## 2024-01-09 MED ORDER — COCONUT OIL OIL: 1.0000 | TOPICAL_OIL | Status: DC | PRN

## 2024-01-09 MED ORDER — DIPHENHYDRAMINE HCL 25 MG PO CAPS: 25.0000 mg | ORAL_CAPSULE | ORAL | Status: DC | PRN

## 2024-01-09 MED ORDER — CALCIUM CARBONATE ANTACID 500 MG PO CHEW: 1.0000 | CHEWABLE_TABLET | Freq: Three times a day (TID) | ORAL | Status: DC | PRN

## 2024-01-09 MED ORDER — DIBUCAINE (PERIANAL) 1 % EX OINT
1.0000 | TOPICAL_OINTMENT | CUTANEOUS | Status: DC | PRN
Start: 2024-01-09 — End: 2024-01-11

## 2024-01-09 MED ORDER — HYDROMORPHONE HCL 2 MG PO TABS: 2.0000 mg | ORAL_TABLET | Freq: Four times a day (QID) | ORAL | Status: DC | PRN

## 2024-01-09 MED ORDER — SIMETHICONE 80 MG PO CHEW: 80.0000 mg | CHEWABLE_TABLET | ORAL | Status: DC | PRN

## 2024-01-09 NOTE — Discharge Instructions (Signed)
..  Some PCP options in [place] area- not a comprehensive list  [facility]- [phone] [facility]- [phone] [facility]- [phone] [facility]- [phone] [facility]- [phone] Nichole Molly- [phone]  or [facility] Physician Referral Line [phone]
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
## 2024-01-09 NOTE — Anesthesia Postprocedure Evaluation (Signed)
 Anesthesia Post Note  Patient: Olivia Alexander  Procedure(s) Performed: CESAREAN DELIVERY  Patient location during evaluation: Mother Baby Anesthesia Type: Spinal Level of consciousness: awake and alert Pain management: pain level controlled Vital Signs Assessment: post-procedure vital signs reviewed and stable Respiratory status: spontaneous breathing, nonlabored ventilation and respiratory function stable Cardiovascular status: stable Postop Assessment: no headache, no backache and epidural receding Anesthetic complications: no   No notable events documented.   Last Vitals:  Vitals:   01/09/24 0600 01/09/24 0740  BP:  (!) 159/88  Pulse: 75   Resp:  20  Temp:  36.7 C  SpO2: 97% 97%    Last Pain:  Vitals:   01/09/24 0740  TempSrc: Oral  PainSc:                  Madalyn Hammock

## 2024-01-09 NOTE — Progress Notes (Signed)
 Subjective: Postpartum Day 0: Cesarean Delivery  Olivia Alexander is feeling well overall. She is ambulating, voiding, and tolerating POs without difficulty. Her pain is well-controlled and her bleeding is WNL. Her mood is stable. She has been to the SCN to see the baby. Her BPs have  been 130s-160s/70s-90s. She denies HA, visual changes, and epigastric pain.  She has not had a timed PP blood sugar yet. It was 139 about 30 minutes after eating breakfast and iced coffe - will check after lunch. Brandin is aware that there will be a Child psychotherapist coming to see her d/t substance use.   Objective: Vital signs in last 24 hours: Temp:  [97.6 F (36.4 C)-98.8 F (37.1 C)] 98.5 F (36.9 C) (08/12 1042) Pulse Rate:  [73-102] 73 (08/12 1042) Resp:  [16-20] 17 (08/12 1042) BP: (128-182)/(76-103) 152/90 (08/12 1042) SpO2:  [93 %-100 %] 100 % (08/12 1042) Weight:  [104.3 kg] 104.3 kg (08/11 1155)  Physical Exam:  General: alert and cooperative Lochia: appropriate Uterine Fundus: firm Incision: healing well, wound vac in place DVT Evaluation: No evidence of DVT seen on physical exam. +1 pitting edema in BLE  Recent Labs    01/08/24 1232 01/09/24 0449  HGB 10.8* 8.9*  HCT 34.8* 28.1*    Assessment/Plan: Status post Cesarean section.  MRBPs - increase labetalol  to 300 mg BID and add Lasix  20 mg x 5 days  Social work consult Check PP CBG  Routine PP care  Eleanor CHRISTELLA Canny, CNM 01/09/2024, 10:45 AM

## 2024-01-10 LAB — CULTURE, BETA STREP (GROUP B ONLY)

## 2024-01-10 LAB — SURGICAL PATHOLOGY

## 2024-01-10 MED ORDER — LABETALOL HCL 200 MG PO TABS
400.0000 mg | ORAL_TABLET | Freq: Two times a day (BID) | ORAL | Status: DC
  Administered 2024-01-10 – 2024-01-11 (×3): 400 mg via ORAL
  Filled 2024-01-10 (×2): qty 2

## 2024-01-10 MED ORDER — NIFEDIPINE ER OSMOTIC RELEASE 30 MG PO TB24
90.0000 mg | ORAL_TABLET | Freq: Every day | ORAL | Status: DC
  Administered 2024-01-10 – 2024-01-11 (×3): 90 mg via ORAL
  Filled 2024-01-10 (×2): qty 3

## 2024-01-10 NOTE — Progress Notes (Signed)
 During 1600 rounds, pt was found not to be in her room.  Pt also not at bedside in the SCN.  Pt did not notify nursing staff that she was leaving the floor.  Patient was in the room during 1500 rounds.

## 2024-01-10 NOTE — Progress Notes (Signed)
 Obstetric Postpartum/PostOperative Daily Progress Note Subjective:  38 y.o. H5E8877 post-operative day # 2 status post repeat cesarean delivery.  She is ambulating, is tolerating po, is voiding spontaneously.  Her pain is well controlled on PO pain medications. Her lochia is less than menses. Continues to have mild range- near severe range- elevated pressures.   Medications SCHEDULED MEDICATIONS   acetaminophen   1,000 mg Oral Q6H   enoxaparin  (LOVENOX ) injection  0.5 mg/kg Subcutaneous Q24H   famotidine   20 mg Oral BID   ferrous sulfate   325 mg Oral Q breakfast   furosemide   20 mg Oral Daily   gabapentin   200 mg Oral TID   ibuprofen   800 mg Oral TID   labetalol   300 mg Oral BID   nicotine   14 mg Transdermal Daily   NIFEdipine   90 mg Oral Daily   prenatal multivitamin  1 tablet Oral Q1200   senna-docusate  2 tablet Oral Daily   sertraline   50 mg Oral Daily   simethicone   80 mg Oral TID PC    MEDICATION INFUSIONS   naloxone  HCl (NARCAN ) 2 mg in dextrose  5 % 250 mL infusion      PRN MEDICATIONS  albuterol , calcium  carbonate, coconut oil, witch hazel-glycerin  **AND** dibucaine, diphenhydrAMINE  **OR** diphenhydrAMINE , labetalol  **AND** labetalol  **AND** labetalol  **AND** hydrALAZINE  **AND** [CANCELED] Measure blood pressure, HYDROmorphone , menthol -cetylpyridinium, naloxone  **AND** sodium chloride  flush, naloxone  HCl (NARCAN ) 2 mg in dextrose  5 % 250 mL infusion, ondansetron  (ZOFRAN ) IV, oxyCODONE , simethicone     Objective:   Vitals:   01/10/24 0320 01/10/24 0755 01/10/24 0800 01/10/24 1300  BP: 129/74 (!) 179/91 (!) 148/85 (!) 156/88  Pulse: 80 84    Resp: 18 (!) 22    Temp: 98.3 F (36.8 C) 98.2 F (36.8 C)    TempSrc:  Oral    SpO2: 97% 100%    Weight:      Height:        Current Vital Signs 24h Vital Sign Ranges  T 98.2 F (36.8 C) Temp  Avg: 98.4 F (36.9 C)  Min: 98.2 F (36.8 C)  Max: 98.9 F (37.2 C)  BP (!) 156/88 BP  Min: 129/74  Max: 179/91  HR 84 Pulse  Avg:  86.4  Min: 80  Max: 92  RR (!) 22 Resp  Avg: 19  Min: 17  Max: 22  SaO2 100 % Room Air SpO2  Avg: 98.3 %  Min: 97 %  Max: 100 %       24 Hour I/O Current Shift I/O  Time Ins Outs 08/12 0701 - 08/13 0700 In: 1409 [P.O.:920; I.V.:489] Out: 325 [Urine:325] 08/13 0701 - 08/13 1900 In: 240 [P.O.:240] Out: -   General: NAD Pulmonary: no increased work of breathing Abdomen: non-distended, non-tender, fundus firm at level of umbilicus Inc: Clean/dry/intact Extremities: no edema, no erythema, no tenderness  Labs:  Recent Labs  Lab 01/08/24 1232 01/09/24 0449  WBC 10.1 16.4*  HGB 10.8* 8.9*  HCT 34.8* 28.1*  PLT 251 202     Assessment:   38 y.o. H5E8877 postoperative day # 2 status post repeat cesarean section  Plan:  1) Acute blood loss anemia - hemodynamically stable and asymptomatic - po ferrous sulfate  Not clinically significant this admission  2) A POS / Rubella 2.54 (02/24 1450)/ Varicella Immune  3) TDAP status: offer before discharge  4) Formula feeding  5) Contraception: BTL done at time of c/section  6) CHTN: Procardia  90 mg XL, Labetalol  300 mg BID (increase to 400  mg BID)   6) Disposition: continue current care   Slater Rains, CNM [facility] 01/10/2024 2:17 PM

## 2024-01-10 NOTE — Inpatient Diabetes Management (Addendum)
 Inpatient Diabetes Program Recommendations  AACE/ADA: New Consensus Statement on Inpatient Glycemic Control (2015)  Target Ranges:  Prepandial:   less than 140 mg/dL      Peak postprandial:   less than 180 mg/dL (1-2 hours)      Critically ill patients:  140 - 180 mg/dL    Latest Reference Range & Units 01/09/24 10:39 01/09/24 15:32  Glucose-Capillary 70 - 99 mg/dL 860 (H) 838 (H)  (H): Data is abnormally high    Admit with: H5E8978 at [redacted]w[redacted]d Estimated Date of Delivery: 02/07/24 by LMP c/w 7wk2d u/s, who is being admitted for leaking of fluid   History: DM2  Home DM Meds: Metformin  1000 mg BID  Current Orders: None   Delivered via CS 08/11   MD- May consider adding CBG checks TID AC meals (Can switch to [facility] since pt has delivered)  May need to continue Metformin  after discharge given A1c back in February was 6.3%      --Will follow patient during hospitalization--  Adina Rudolpho Arrow RN, MSN, CDCES Diabetes Coordinator Inpatient Glycemic Control Team Team Pager: [phone] (8a-5p)

## 2024-01-11 ENCOUNTER — Telehealth: Payer: Self-pay

## 2024-01-11 ENCOUNTER — Encounter: Admitting: Obstetrics

## 2024-01-11 MED ORDER — NIFEDIPINE ER OSMOTIC RELEASE 90 MG PO TB24: 90.0000 mg | ORAL_TABLET | Freq: Every day | ORAL | 5 refills | Status: AC

## 2024-01-11 MED ORDER — OXYCODONE HCL 5 MG PO TABS: 5.0000 mg | ORAL_TABLET | ORAL | 0 refills | Status: AC | PRN

## 2024-01-11 MED ORDER — FUROSEMIDE 20 MG PO TABS: 20.0000 mg | ORAL_TABLET | Freq: Every day | ORAL | 0 refills | Status: AC

## 2024-01-11 MED ORDER — SIMETHICONE 80 MG PO CHEW: 80.0000 mg | CHEWABLE_TABLET | Freq: Three times a day (TID) | ORAL | 0 refills | Status: AC

## 2024-01-11 MED ORDER — LABETALOL HCL 400 MG PO TABS: 400.0000 mg | ORAL_TABLET | Freq: Two times a day (BID) | ORAL | 5 refills | Status: AC

## 2024-01-11 NOTE — Progress Notes (Signed)
 Patient returned to room.
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
## 2024-01-11 NOTE — Progress Notes (Signed)
 While this RN was in a room assisting the other RN with a patient we heard yelling from the hall. Walked out of the room to find Olivia Alexander upset. She was not able to visit her baby in the NICU due to policies that her other son is not allowed to visit in the NICU after visiting hours are over and that he, being a minor should not be left in the room alone. The NICU nurse was trying to calmly have a discussion with Olivia Alexander but she was way to upset to have a respectful conversation. At this point the patient had returned to her room. The NICU nurse requested that security be called for safety reasons and for the [facility] to come speak with the patient. Patient was making phone calls that she wanted to leave. She stated that her and her baby were getting out of here. After the arrival of the [facility] there was an agreement to let the patient and her son back into the NICU for 20 minutes to see baby. Patient calmer and apologetic. After the visit patient and son walk to cafeteria for food. Will continue to monitor situation and pass along to day shift nurse.

## 2024-01-11 NOTE — Telephone Encounter (Signed)
 Called pharmacy to check on this request and was advised there are no concerns with this Rx, patient last picked up RF end of July. Pharmacist will cancel future faxes like this one to be sent since there are no issues as of now.

## 2024-01-11 NOTE — Progress Notes (Signed)
 Pt returned to room

## 2024-01-11 NOTE — Lactation Note (Signed)
 This note was copied from a baby's chart. Lactation Consultation Note  Patient Name: Boy Jaela Yepez Unijb'd Date: 01/11/2024 Age:40 hours Reason for consult: Follow-up assessment;Late-preterm 34-36.6wks;NICU baby;Maternal discharge   Maternal Data Met w/ patient of baby boy [place].  Infant is currently in NICU.  This was a repeat c-section delivery.   Patient verbalized that she would like to provide breastmilk to her infant.  Patient has a hx of depression, AMA, cHTN, type 2 DM, THC , alcohol, and nicotine  use.  Patient verbalized that she used cocaine for a tooth ache prior to delivery, and she made a very dumb mistake.  Patient stated she is aware that she will currently have to pump/dump her supply as of now.   MOB does not have a breastpump at home.  Feeding Mother's Current Feeding Choice: Breast Milk and Formula Nipple Type: Slow - flow  Interventions Interventions: Breast feeding basics reviewed;DEBP;Education  LC provided patient with the information for her insurance to help her order a breastpump.  Also encouraged  patient to reach out to [facility].   Education was provided to patient on the use of THC, alcohol, nicotine , and substance use in regards to breastmilk and how it can affect baby.  LC informed mom that she has to have 2 negative UDS a week apart before breastmilk is provided to infant.  Mom is aware that this has to be submitted to the providers.  Discussed the use of the hand pump and making sure she is expressing her breast at least 8x w/in a 24hr period.  Mom verbalized understanding.    Discharge Pump: Advised to call insurance company [facility] Program: Yes  Lactation outpatient number was provided to patient if she has questions or needs assistance. Consult Status Consult Status: PRN    Raymone Pembroke S Shandelle Borrelli 01/11/2024, 12:25 PM

## 2024-01-11 NOTE — Progress Notes (Signed)
 When going to patients room for assessment patient states you're going to be mad at me then proceeds to say that her son that is in the room with her is not going to be able to leave because his father figure cannot come pick him up. Informed patient of policies for the hospital and made the coordinator aware of the situation who also informed the previous day shift coordinator. Decision for exception to be made since the child had no way home. Patient made aware that child had to remain in the room with her and no disruptive behavior. Patient agreeable. Child appropriate and respectful. Will continue to monitor.

## 2024-01-11 NOTE — Progress Notes (Signed)
 Patient and son to cafeteria to get food.

## 2024-01-11 NOTE — Progress Notes (Signed)
 Pt discharged home. Infant in [facility]  Discharge instructions, prescriptions and follow up appointment given to and reviewed with pt. Pt verbalized understanding. Escorted out by [facility]

## 2024-01-11 NOTE — Telephone Encounter (Signed)
 SABRA

## 2024-01-15 ENCOUNTER — Ambulatory Visit: Admitting: Obstetrics

## 2024-01-15 ENCOUNTER — Encounter: Payer: Self-pay | Admitting: Obstetrics

## 2024-01-15 DIAGNOSIS — O99331 Smoking (tobacco) complicating pregnancy, first trimester: Secondary | ICD-10-CM

## 2024-01-15 DIAGNOSIS — O10919 Unspecified pre-existing hypertension complicating pregnancy, unspecified trimester: Secondary | ICD-10-CM

## 2024-01-15 DIAGNOSIS — O24913 Unspecified diabetes mellitus in pregnancy, third trimester: Secondary | ICD-10-CM

## 2024-01-15 DIAGNOSIS — F32A Depression, unspecified: Secondary | ICD-10-CM

## 2024-01-15 DIAGNOSIS — O1003 Pre-existing essential hypertension complicating the puerperium: Secondary | ICD-10-CM

## 2024-01-15 DIAGNOSIS — O99320 Drug use complicating pregnancy, unspecified trimester: Secondary | ICD-10-CM

## 2024-01-15 NOTE — Progress Notes (Signed)
   Postoperative Cesarean Incision Check Olivia Alexander is a 38 y.o. [phone] s/p rLTCS w/BTL at [redacted]w[redacted]d for PPROM in setting of uterine contractions, chronic hypertension, substance use in pregnancy, tobacco use disorder, T2DM, BMI 43.5, POD#7, here today for incision check.  Subjective: Pain is controlled with current analgesics. Medications being used: acetaminophen , ibuprofen  (OTC), and narcotic analgesics including oxycodone  (Oxycontin , Oxyir). She just ran out of narcotic, is requesting more. She denies fever, chills, nausea, and vomiting. Eating a regular diet without difficulty.  Is not having regular bowel movements. Activity: normal activities of daily living. Bleeding is light. She denies issues with her incision, has wound vac still on. Baby in NICU.   Objective: BP (!) 115/57   Pulse 90   Wt 226 lb 3.2 oz (102.6 kg)   LMP 05/03/2023 (Exact Date)   BMI 42.74 kg/m  Body mass index is 42.74 kg/m.  General:  alert and no distress  Abdomen: soft, bowel sounds active, non-tender  Incision:   healing well, no drainage, no erythema, no hernia, no seroma, no swelling, no dehiscence, incision well approximated    Assessment/Plan: [name] is a 38 y.o. [id number] s/p rLTCS w/BTL at [redacted]w[redacted]d for PPROM in setting of uterine contractions, chronic hypertension, substance use in pregnancy, tobacco use disorder, T2DM, BMI 43.5, POD#7, here today for incision check, healing well. No concerns with incision today, wound vac and remaining steri-strips removed and replaced.  -Discussed at-home care, healing expectations, si/sx of infection.  -Call clinic if developing redness, discharge, or increasing pain. -Avoid vigorous scrubbing/washing of incision site; hygiene reviewed. -May trim back steri-strips if they peel; should fully remove after 1 week from today. -May resume driving and light walking. Still no heavy lifting >10-12 lbs and pelvic rest advised until cleared at 6wk postpartum visit.   -Declined to refill Percocet, should transition to APAP/IBU only at this point. Can use abdominal binder prn.  -If stooling regularly x 1-2 weeks, can take stool softener every other day x 1 week, taper as tolerated.  cHTN:  -BP 115/57 today, continue current medications -Notify clinic if developing si/sx of preeclampsia  A2GDM:  -Now off Metformin , plan for 2hGTT at 6wk PPV  Tobacco/SUD -Cessation encouraged  Return in about 5 weeks (around 02/19/2024) for 6wk PPV and 2hr GTT.   Estil Mangle, DO [facility]

## 2024-02-16 NOTE — Progress Notes (Deleted)
   OBSTETRICS POSTPARTUM CLINIC PROGRESS NOTE  Subjective:     Olivia Alexander is a 38 y.o. [phone] female who presents for a postpartum visit. She is 5 weeks postpartum following a repeat low cervical transverse Cesarean section with BTL. I have reviewed the prenatal and intrapartum course. The delivery was at [redacted]w[redacted]d gestational weeks.  Anesthesia: spinal. Postpartum course has been ***. Baby's course has been ***. Baby is feeding by {breast/bottle:69}. Bleeding: patient {HAS HAS WNU:81165} not resumed menses, with Patient's last menstrual period was 05/03/2023 (exact date).. Bowel function is {normal:32111}. Bladder function is {normal:32111}. Patient {is/is not:9024} sexually active. Contraception method desired is tubal ligation. Postpartum depression screening: {neg default:13464::negative}.  EDPS score is ***.    The following portions of the patient's history were reviewed and updated as appropriate: allergies, current medications, past family history, past medical history, past social history, past surgical history, and problem list.  Review of Systems {ros; complete:30496}   Objective:    LMP 05/03/2023 (Exact Date)   General:  alert and no distress   Breasts:  inspection negative, no nipple discharge or bleeding, no masses or nodularity palpable  Lungs: clear to auscultation bilaterally  Heart:  regular rate and rhythm, S1, S2 normal, no murmur, click, rub or gallop  Abdomen: soft, non-tender; bowel sounds normal; no masses,  no organomegaly.  ***Well healed Pfannenstiel incision   Vulva:  normal  Vagina: normal vagina, no discharge, exudate, lesion, or erythema  Cervix:  no cervical motion tenderness and no lesions  Corpus: normal size, contour, position, consistency, mobility, non-tender  Adnexa:  normal adnexa and no mass, fullness, tenderness  Rectal Exam: Not performed.         Labs:  Lab Results  Component Value Date   HGB 8.9 (L) 01/09/2024     Assessment:   No  diagnosis found.   Plan:    1. Contraception: {method:5051} 2. Will check Hgb for h/o postpartum anemia of less than 10.  3. Follow up in: {1-10:13787} {time; units:19136} or as needed.    Vicci Rollo BRAVO, RN [facility]

## 2024-02-18 ENCOUNTER — Other Ambulatory Visit: Payer: Self-pay | Admitting: Licensed Practical Nurse

## 2024-02-18 DIAGNOSIS — O0990 Supervision of high risk pregnancy, unspecified, unspecified trimester: Secondary | ICD-10-CM

## 2024-02-18 DIAGNOSIS — O24112 Pre-existing diabetes mellitus, type 2, in pregnancy, second trimester: Secondary | ICD-10-CM

## 2024-02-20 ENCOUNTER — Ambulatory Visit: Admitting: Obstetrics

## 2024-02-20 ENCOUNTER — Telehealth: Payer: Self-pay | Admitting: Obstetrics

## 2024-02-20 ENCOUNTER — Other Ambulatory Visit

## 2024-02-20 ENCOUNTER — Telehealth: Payer: Self-pay | Admitting: Obstetrics & Gynecology

## 2024-02-20 DIAGNOSIS — E611 Iron deficiency: Secondary | ICD-10-CM

## 2024-02-20 DIAGNOSIS — Z124 Encounter for screening for malignant neoplasm of cervix: Secondary | ICD-10-CM

## 2024-02-20 NOTE — Telephone Encounter (Signed)
 Pt has rescheduled the lab appt to 02/22/2024.

## 2024-02-20 NOTE — Telephone Encounter (Signed)
 Reached out to pt to reschedule 2hr GTT lab that was scheduled on 02/20/2024 at 8:20.  Left message for pt to call back.

## 2024-02-22 ENCOUNTER — Other Ambulatory Visit

## 2024-02-22 DIAGNOSIS — O24913 Unspecified diabetes mellitus in pregnancy, third trimester: Secondary | ICD-10-CM | POA: Diagnosis not present

## 2024-02-23 LAB — GLUCOSE TOLERANCE, 2 HOURS
Glucose, 2 hour: 51 mg/dL — ABNORMAL LOW (ref 70–139)
Glucose, GTT - Fasting: 84 mg/dL (ref 70–99)

## 2024-02-26 ENCOUNTER — Ambulatory Visit: Payer: Self-pay | Admitting: Obstetrics

## 2024-03-07 NOTE — Telephone Encounter (Signed)
No further actions are needed 

## 2024-03-10 ENCOUNTER — Other Ambulatory Visit: Payer: Self-pay | Admitting: Licensed Practical Nurse

## 2024-03-10 DIAGNOSIS — O0990 Supervision of high risk pregnancy, unspecified, unspecified trimester: Secondary | ICD-10-CM

## 2024-03-10 DIAGNOSIS — O10919 Unspecified pre-existing hypertension complicating pregnancy, unspecified trimester: Secondary | ICD-10-CM
